# Patient Record
Sex: Female | Born: 1995 | Race: White | Hispanic: No | Marital: Single | Smoking: Never smoker
Health system: Southern US, Community
[De-identification: ages and names within clinical notes are randomized; demographics above are authoritative.]

## PROBLEM LIST (undated history)

## (undated) DIAGNOSIS — F909 Attention-deficit hyperactivity disorder, unspecified type: Secondary | ICD-10-CM

## (undated) DIAGNOSIS — R51 Headache: Secondary | ICD-10-CM

## (undated) DIAGNOSIS — J309 Allergic rhinitis, unspecified: Secondary | ICD-10-CM

## (undated) DIAGNOSIS — F8189 Other developmental disorders of scholastic skills: Secondary | ICD-10-CM

## (undated) DIAGNOSIS — R042 Hemoptysis: Secondary | ICD-10-CM

## (undated) DIAGNOSIS — B2790 Infectious mononucleosis, unspecified without complication: Principal | ICD-10-CM

## (undated) DIAGNOSIS — G43109 Migraine with aura, not intractable, without status migrainosus: Secondary | ICD-10-CM

## (undated) DIAGNOSIS — L708 Other acne: Secondary | ICD-10-CM

## (undated) DIAGNOSIS — J45909 Unspecified asthma, uncomplicated: Secondary | ICD-10-CM

## (undated) DIAGNOSIS — F919 Conduct disorder, unspecified: Secondary | ICD-10-CM

## (undated) DIAGNOSIS — K759 Inflammatory liver disease, unspecified: Secondary | ICD-10-CM

## (undated) DIAGNOSIS — Z872 Personal history of diseases of the skin and subcutaneous tissue: Secondary | ICD-10-CM

## (undated) HISTORY — DX: Infectious mononucleosis, unspecified without complication: B27.90

## (undated) HISTORY — DX: Other acne: L70.8

## (undated) HISTORY — DX: Personal history of diseases of the skin and subcutaneous tissue: Z87.2

## (undated) HISTORY — PX: NO PAST SURGERIES: SHX2092

## (undated) HISTORY — DX: Hemoptysis: R04.2

## (undated) HISTORY — DX: Inflammatory liver disease, unspecified: K75.9

## (undated) HISTORY — DX: Attention-deficit hyperactivity disorder, unspecified type: F90.9

## (undated) HISTORY — DX: Allergic rhinitis, unspecified: J30.9

## (undated) HISTORY — DX: Unspecified asthma, uncomplicated: J45.909

## (undated) HISTORY — DX: Conduct disorder, unspecified: F91.9

## (undated) HISTORY — DX: Migraine with aura, not intractable, without status migrainosus: G43.109

## (undated) HISTORY — DX: Other developmental disorders of scholastic skills: F81.89

## (undated) HISTORY — DX: Headache: R51

---
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
## 2015-05-07 ENCOUNTER — Ambulatory Visit (INDEPENDENT_AMBULATORY_CARE_PROVIDER_SITE_OTHER): Payer: Managed Care, Other (non HMO) | Admitting: Family Medicine

## 2015-05-07 ENCOUNTER — Encounter: Payer: Self-pay | Admitting: Family Medicine

## 2015-05-07 VITALS — BP 106/86 | HR 114 | Temp 98.4°F | Ht 62.0 in | Wt 128.0 lb

## 2015-05-07 DIAGNOSIS — R309 Painful micturition, unspecified: Secondary | ICD-10-CM | POA: Diagnosis not present

## 2015-05-07 DIAGNOSIS — N390 Urinary tract infection, site not specified: Secondary | ICD-10-CM | POA: Diagnosis not present

## 2015-05-07 LAB — POCT URINALYSIS DIPSTICK
Bilirubin, UA: NEGATIVE
GLUCOSE UA: NEGATIVE
Ketones, UA: NEGATIVE
NITRITE UA: NEGATIVE
Spec Grav, UA: >=1.03
Urobilinogen, UA: 0.2
pH, UA: 6

## 2015-05-07 MED ORDER — CIPROFLOXACIN HCL 500 MG PO TABS: 500.0000 mg | ORAL_TABLET | Freq: Two times a day (BID) | ORAL | Status: DC

## 2015-05-07 NOTE — Progress Notes (Signed)
   Subjective:    Patient ID: Paige CommanderCaroline G Dorr, female    DOB: 03/09/1996, 19 y.o.   MRN: [id number]  HPI Here for the onset this am of urinary burning and urgency. No fever.    Review of Systems  Constitutional: Negative.   Genitourinary: Positive for dysuria, urgency and frequency. Negative for flank pain and pelvic pain.       Objective:   Physical Exam  Constitutional: She appears well-developed and well-nourished.  Cardiovascular: Normal rate, regular rhythm, normal heart sounds and intact distal pulses.   Pulmonary/Chest: Effort normal and breath sounds normal.  Abdominal: Soft. Bowel sounds are normal. She exhibits no distension and no mass. There is no tenderness. There is no rebound and no guarding.          Assessment & Plan:  UTI, treat with Cipro. Drink plenty of water. Culture the sample.

## 2015-05-07 NOTE — Progress Notes (Signed)
Pre visit review using our clinic review tool, if applicable. No additional management support is needed unless otherwise documented below in the visit note. 

## 2015-05-09 LAB — URINE CULTURE

## 2015-05-16 ENCOUNTER — Ambulatory Visit (INDEPENDENT_AMBULATORY_CARE_PROVIDER_SITE_OTHER): Payer: Managed Care, Other (non HMO) | Admitting: Family Medicine

## 2015-05-16 ENCOUNTER — Encounter: Payer: Self-pay | Admitting: Family Medicine

## 2015-05-16 VITALS — BP 120/70 | Temp 98.5°F | Ht 62.0 in | Wt 128.0 lb

## 2015-05-16 DIAGNOSIS — J209 Acute bronchitis, unspecified: Secondary | ICD-10-CM

## 2015-05-16 MED ORDER — AZITHROMYCIN 250 MG PO TABS: ORAL_TABLET | ORAL | Status: DC

## 2015-05-16 NOTE — Progress Notes (Signed)
Pre visit review using our clinic review tool, if applicable. No additional management support is needed unless otherwise documented below in the visit note. 

## 2015-05-16 NOTE — Progress Notes (Signed)
   Subjective:    Patient ID: Nemiah CommanderCaroline G Chaviano, female    DOB: 04/29/1996, 19 y.o.   MRN: [id number]  HPI Here for 8 days of fever, PND, chest congestion, and a dry cough. On Mucinex. She was recently treated for  UTI, and those symptoms have all resolved.    Review of Systems  Constitutional: Positive for fever.  HENT: Positive for congestion and postnasal drip. Negative for sinus pressure and sore throat.   Eyes: Negative.   Respiratory: Positive for cough and chest tightness.        Objective:   Physical Exam  Constitutional: She appears well-developed and well-nourished.  HENT:  Right Ear: External ear normal.  Left Ear: External ear normal.  Nose: Nose normal.  Mouth/Throat: Oropharynx is clear and moist.  Eyes: Conjunctivae are normal.  Neck: No thyromegaly present.  Pulmonary/Chest: Effort normal. No respiratory distress. She has no wheezes. She has no rales.  Scattered rhonchi   Lymphadenopathy:    She has no cervical adenopathy.          Assessment & Plan:  Bronchitis, treat with a Zpack.

## 2015-08-15 ENCOUNTER — Encounter: Payer: Self-pay | Admitting: Family Medicine

## 2015-08-15 ENCOUNTER — Telehealth: Payer: Self-pay | Admitting: Family Medicine

## 2015-08-15 ENCOUNTER — Ambulatory Visit (INDEPENDENT_AMBULATORY_CARE_PROVIDER_SITE_OTHER): Payer: Managed Care, Other (non HMO) | Admitting: Family Medicine

## 2015-08-15 VITALS — BP 126/75 | HR 79 | Temp 98.4°F | Ht 62.0 in | Wt 128.0 lb

## 2015-08-15 DIAGNOSIS — R109 Unspecified abdominal pain: Secondary | ICD-10-CM | POA: Diagnosis not present

## 2015-08-15 DIAGNOSIS — J019 Acute sinusitis, unspecified: Secondary | ICD-10-CM

## 2015-08-15 LAB — POC URINALSYSI DIPSTICK (AUTOMATED)
BILIRUBIN UA: NEGATIVE
Glucose, UA: NEGATIVE
Ketones, UA: NEGATIVE
LEUKOCYTES UA: NEGATIVE
Nitrite, UA: NEGATIVE
Protein, UA: NEGATIVE
RBC UA: NEGATIVE
Spec Grav, UA: >=1.03
Urobilinogen, UA: 0.2
pH, UA: 5.5

## 2015-08-15 MED ORDER — AZITHROMYCIN 250 MG PO TABS: ORAL_TABLET | ORAL | Status: DC

## 2015-08-15 NOTE — Telephone Encounter (Signed)
Patient Name: Paige Herrera  DOB: April 24, 1996    Initial Comment Caller state she has a fever of 99.3   Nurse Assessment      Guidelines    Guideline Title Affirmed Question Affirmed Notes       Final Disposition User   FINAL ATTEMPT MADE - no message left Stand[place], RN, [employer]

## 2015-08-15 NOTE — Progress Notes (Signed)
   Subjective:    Patient ID: Paige Herrera, female    DOB: [birth year], 20 y.o.   MRN: [id number]  HPI Here for 3 days of low grade fevers, headache, ST, PND , dry cough, and some mild abdominal cramps. She vomited once last night. Drinking fluids.    Review of Systems  Constitutional: Positive for fever. Negative for chills and diaphoresis.  HENT: Positive for congestion, postnasal drip, sinus pressure and sore throat.   Eyes: Negative.   Respiratory: Negative.   Gastrointestinal: Positive for nausea, vomiting and abdominal pain. Negative for diarrhea, constipation, blood in stool, abdominal distention and anal bleeding.       Objective:   Physical Exam  Constitutional: She appears well-developed and well-nourished.  HENT:  Right Ear: External ear normal.  Left Ear: External ear normal.  Nose: Nose normal.  Mouth/Throat: Oropharynx is clear and moist.  Eyes: Conjunctivae are normal.  Neck: No thyromegaly present.  Pulmonary/Chest: Effort normal and breath sounds normal.  Abdominal: Soft. Bowel sounds are normal. She exhibits no distension and no mass. There is no tenderness. There is no rebound and no guarding.  Lymphadenopathy:    She has no cervical adenopathy.          Assessment & Plan:  Sinusitis secondary to a viral illness. Treat with a Zpack. Written out of work yesterday through tomorrow.

## 2015-08-15 NOTE — Telephone Encounter (Signed)
Pt is schedule to see Dr Clent Ridges today 08/815 at 11:00 am

## 2015-08-15 NOTE — Addendum Note (Signed)
Addended by: Aniceto Boss A on: 08/15/2015 01:35 PM   Modules accepted: Orders

## 2015-08-15 NOTE — Progress Notes (Signed)
Pre visit review using our clinic review tool, if applicable. No additional management support is needed unless otherwise documented below in the visit note. 

## 2015-10-03 ENCOUNTER — Telehealth: Payer: Self-pay | Admitting: Family Medicine

## 2015-10-03 NOTE — Telephone Encounter (Signed)
Pt has been sch

## 2015-10-03 NOTE — Telephone Encounter (Signed)
Any specific day? Can use a same day if we need to?

## 2015-10-03 NOTE — Telephone Encounter (Signed)
Pt would like to come in after 2pm . Pt is having trouble eating ?mono

## 2015-10-03 NOTE — Telephone Encounter (Signed)
Sorry this friday

## 2015-10-05 ENCOUNTER — Ambulatory Visit (INDEPENDENT_AMBULATORY_CARE_PROVIDER_SITE_OTHER): Payer: Managed Care, Other (non HMO) | Admitting: Family Medicine

## 2015-10-05 ENCOUNTER — Encounter: Payer: Self-pay | Admitting: Family Medicine

## 2015-10-05 VITALS — BP 112/67 | HR 82 | Temp 98.4°F | Ht 62.0 in | Wt 124.0 lb

## 2015-10-05 DIAGNOSIS — G43109 Migraine with aura, not intractable, without status migrainosus: Secondary | ICD-10-CM | POA: Diagnosis not present

## 2015-10-05 DIAGNOSIS — J3089 Other allergic rhinitis: Secondary | ICD-10-CM

## 2015-10-05 DIAGNOSIS — F4321 Adjustment disorder with depressed mood: Secondary | ICD-10-CM

## 2015-10-05 MED ORDER — PAROXETINE HCL 20 MG PO TABS: 20.0000 mg | ORAL_TABLET | Freq: Every day | ORAL | Status: DC

## 2015-10-05 NOTE — Progress Notes (Signed)
   Subjective:    Patient ID: Paige Herrera, female    DOB: 06/11/1996, 20 y.o.   MRN: [id number]  HPI Here with  Mother for several issues. First she decided to stop all her psychiatric meds suddenly about 2 weeks ago. This resulted in her feeling extremely stressed and she developed a migraine headache that has lasted 10 days.  No blurred vision or neurologic deficits. She also has had a week of sinus pressure, ear pressure, and PND. No ST or cough or fever.    Review of Systems  Constitutional: Negative.   HENT: Positive for congestion, ear pain, postnasal drip, sinus pressure and sneezing. Negative for sore throat.   Eyes: Negative.   Respiratory: Negative.   Cardiovascular: Negative.   Neurological: Positive for headaches.  Psychiatric/Behavioral: Positive for dysphoric mood, decreased concentration and agitation. Negative for suicidal ideas, behavioral problems and self-injury. The patient is nervous/anxious.        Objective:   Physical Exam  Constitutional: She is oriented to person, place, and time. She appears well-developed and well-nourished.  HENT:  Right Ear: External ear normal.  Left Ear: External ear normal.  Nose: Nose normal.  Mouth/Throat: Oropharynx is clear and moist.  Eyes: Conjunctivae are normal.  Neck: No thyromegaly present.  Cardiovascular: Normal rate, regular rhythm, normal heart sounds and intact distal pulses.   Pulmonary/Chest: Effort normal and breath sounds normal.  Lymphadenopathy:    She has no cervical adenopathy.  Neurological: She is alert and oriented to person, place, and time.  Psychiatric: Her behavior is normal. Thought content normal.  Anxious           Assessment & Plan:  First I advised her to never stop her medications without asking us for advice. I think her migraines were started by stopping her meds too abruptly. She will resume taking Wellbutrin XL 150 mg daily. She will also increase the Paxil to 20 mg daily. She is  having a flare of her spring time allergies so I advised her to start taking Claritin and Flonase every day. She may add Mucinex as needed for congestion.  Nelwyn SalisburyFRY,STEPHEN A, MD

## 2015-10-05 NOTE — Progress Notes (Signed)
Pre visit review using our clinic review tool, if applicable. No additional management support is needed unless otherwise documented below in the visit note. 

## 2015-10-08 ENCOUNTER — Encounter: Payer: Self-pay | Admitting: Family Medicine

## 2015-10-09 MED ORDER — METHYLPREDNISOLONE 4 MG PO TBPK: ORAL_TABLET | ORAL | Status: DC

## 2015-10-10 ENCOUNTER — Ambulatory Visit (INDEPENDENT_AMBULATORY_CARE_PROVIDER_SITE_OTHER): Payer: Managed Care, Other (non HMO) | Admitting: Family Medicine

## 2015-10-10 ENCOUNTER — Ambulatory Visit: Payer: Managed Care, Other (non HMO) | Admitting: Family Medicine

## 2015-10-10 ENCOUNTER — Encounter: Payer: Self-pay | Admitting: Family Medicine

## 2015-10-10 VITALS — BP 103/76 | HR 85 | Temp 97.8°F | Ht 62.0 in | Wt 126.0 lb

## 2015-10-10 DIAGNOSIS — G43109 Migraine with aura, not intractable, without status migrainosus: Secondary | ICD-10-CM | POA: Diagnosis not present

## 2015-10-10 MED ORDER — SUMATRIPTAN SUCCINATE 100 MG PO TABS: 100.0000 mg | ORAL_TABLET | ORAL | Status: DC | PRN

## 2015-10-10 NOTE — Progress Notes (Signed)
   Subjective:    Patient ID: Paige Herrera, female    DOB: 04/21/1996, 20 y.o.   MRN: [id number]  HPI Here for 3 days of intermittent headaches. Sometimes she wakes up with a headache, and other times it comes on during the day. These are centered over one eye or the other, but they then generalize over the top of the head. She gets very sensitive to light with these headaches. She often gets nausea wit them. No vision changes. Her next menstrual cycle is due to begin in 10 days. No sinus symptoms. She has been taking her Paxil and Wellbutrin every day as we discussed at our last visit.    Review of Systems  Constitutional: Negative.   Eyes: Positive for photophobia. Negative for pain and visual disturbance.  Respiratory: Negative.   Cardiovascular: Negative.   Neurological: Positive for headaches. Negative for dizziness, tremors, seizures, syncope, facial asymmetry, speech difficulty, weakness, light-headedness and numbness.  Psychiatric/Behavioral: Negative.        Objective:   Physical Exam  Constitutional: She is oriented to person, place, and time. She appears well-developed and well-nourished. No distress.  HENT:  Head: Normocephalic and atraumatic.  Eyes: Conjunctivae and EOM are normal. Pupils are equal, round, and reactive to light.  Neck: Neck supple. No thyromegaly present.  Cardiovascular: Normal rate, regular rhythm, normal heart sounds and intact distal pulses.   Pulmonary/Chest: Effort normal and breath sounds normal.  Lymphadenopathy:    She has no cervical adenopathy.  Neurological: She is alert and oriented to person, place, and time. No cranial nerve deficit. Coordination normal.          Assessment & Plan:  These are most likely migraines. Try Sumatriptan with Ibuprofen as needed. Recheck prn

## 2015-10-10 NOTE — Progress Notes (Signed)
Pre visit review using our clinic review tool, if applicable. No additional management support is needed unless otherwise documented below in the visit note. 

## 2015-11-11 ENCOUNTER — Other Ambulatory Visit: Payer: Self-pay | Admitting: Family Medicine

## 2015-11-12 NOTE — Telephone Encounter (Signed)
Refill for 90 days only. She needs a yearly pelvic exam and Pap smear. She could either see us or see a GYN.

## 2015-11-12 NOTE — Telephone Encounter (Signed)
I left a message with below information and sent script e-scribe.

## 2015-12-03 ENCOUNTER — Encounter: Payer: Self-pay | Admitting: Family Medicine

## 2015-12-04 NOTE — Telephone Encounter (Signed)
Per Dr. Clent RidgesFry okay to schedule pt for today.

## 2015-12-06 ENCOUNTER — Encounter: Payer: Self-pay | Admitting: Family Medicine

## 2015-12-06 ENCOUNTER — Ambulatory Visit: Payer: Managed Care, Other (non HMO) | Admitting: Internal Medicine

## 2015-12-06 ENCOUNTER — Telehealth: Payer: Self-pay | Admitting: Family Medicine

## 2015-12-06 MED ORDER — CIPROFLOXACIN HCL 500 MG PO TABS: 500.0000 mg | ORAL_TABLET | Freq: Two times a day (BID) | ORAL | Status: DC

## 2015-12-06 NOTE — Telephone Encounter (Signed)
Per Dr. Clent RidgesFry pt will need to schedule with another provider, we are full this morning.

## 2015-12-06 NOTE — Telephone Encounter (Signed)
This is a duplicate phone note, see previous one.  

## 2015-12-06 NOTE — Telephone Encounter (Signed)
Pt has been sch w/panosh at 1015 am today

## 2015-12-06 NOTE — Telephone Encounter (Signed)
Call in Cipro 500 mg bid for 7 days and drink lots of water

## 2015-12-06 NOTE — Telephone Encounter (Signed)
I sent in script and spoke with mom.

## 2015-12-06 NOTE — Telephone Encounter (Signed)
Mother would to know if you can see daughter for poss UTI this morning?

## 2015-12-06 NOTE — Telephone Encounter (Signed)
Mom states pt has burning and is positive pt has an UTI. Mom states Dr Clent RidgesFry has treated pt in the past for this same issue.  They are trying to leave for the funeral of her mom  by 12:30 today. Pt would like something called in to  [facility] [address]  Mom states she would not normally ask, but the are in a situation with all that is going on.

## 2016-01-23 ENCOUNTER — Telehealth: Payer: Self-pay | Admitting: Family Medicine

## 2016-01-23 NOTE — Telephone Encounter (Signed)
Pt mom would like dr Fabian Sharppanosh to do pap on her daughter instead of dr fry.Can I sch?

## 2016-01-23 NOTE — Telephone Encounter (Signed)
Respectfully decline .   Discuss   options with  PCP .

## 2016-01-24 NOTE — Telephone Encounter (Signed)
Pt mom is aware 

## 2016-02-05 ENCOUNTER — Other Ambulatory Visit: Payer: Self-pay | Admitting: Family Medicine

## 2016-03-04 ENCOUNTER — Other Ambulatory Visit: Payer: Self-pay | Admitting: Family Medicine

## 2016-04-04 ENCOUNTER — Ambulatory Visit (INDEPENDENT_AMBULATORY_CARE_PROVIDER_SITE_OTHER): Payer: Managed Care, Other (non HMO) | Admitting: Family Medicine

## 2016-04-04 VITALS — BP 113/78 | HR 87 | Temp 98.3°F | Ht 62.0 in | Wt 126.0 lb

## 2016-04-04 DIAGNOSIS — N390 Urinary tract infection, site not specified: Secondary | ICD-10-CM | POA: Diagnosis not present

## 2016-04-04 LAB — POC URINALSYSI DIPSTICK (AUTOMATED)
BILIRUBIN UA: NEGATIVE
Glucose, UA: NEGATIVE
KETONES UA: NEGATIVE
PH UA: 6
Urobilinogen, UA: 0.2

## 2016-04-04 MED ORDER — CIPROFLOXACIN HCL 500 MG PO TABS: 500.0000 mg | ORAL_TABLET | Freq: Two times a day (BID) | ORAL | 0 refills | Status: DC

## 2016-04-04 NOTE — Progress Notes (Signed)
Pre visit review using our clinic review tool, if applicable. No additional management support is needed unless otherwise documented below in the visit note. 

## 2016-04-07 ENCOUNTER — Encounter: Payer: Self-pay | Admitting: Family Medicine

## 2016-04-07 LAB — URINE CULTURE

## 2016-04-07 NOTE — Progress Notes (Signed)
   Subjective:    Patient ID: Paige CommanderCaroline G Goldston, female    DOB: 12/17/1995, 20 y.o.   MRN: [id number]  HPI Here for 2 days of pressure to urinate with burning. She drinks lots of water.    Review of Systems  Constitutional: Negative.   Respiratory: Negative.   Cardiovascular: Negative.   Gastrointestinal: Negative.   Genitourinary: Positive for dysuria, frequency and urgency. Negative for flank pain, hematuria and pelvic pain.       Objective:   Physical Exam  Constitutional: She appears well-developed and well-nourished.  Cardiovascular: Normal rate, regular rhythm, normal heart sounds and intact distal pulses.   Pulmonary/Chest: Effort normal and breath sounds normal.  Abdominal: Soft. Bowel sounds are normal. She exhibits no distension and no mass. There is no tenderness. There is no rebound and no guarding.          Assessment & Plan:  UTI, treat with Cipro. Culture the sample.  Nelwyn SalisburyFRY,Tasmia Blumer A, MD

## 2016-04-15 ENCOUNTER — Encounter: Payer: Self-pay | Admitting: Family Medicine

## 2016-04-15 ENCOUNTER — Ambulatory Visit (INDEPENDENT_AMBULATORY_CARE_PROVIDER_SITE_OTHER): Payer: Managed Care, Other (non HMO) | Admitting: Family Medicine

## 2016-04-15 VITALS — BP 111/79 | HR 85 | Temp 98.4°F | Ht 62.0 in | Wt 126.0 lb

## 2016-04-15 DIAGNOSIS — H6983 Other specified disorders of Eustachian tube, bilateral: Secondary | ICD-10-CM | POA: Diagnosis not present

## 2016-04-15 NOTE — Progress Notes (Signed)
Pre visit review using our clinic review tool, if applicable. No additional management support is needed unless otherwise documented below in the visit note. 

## 2016-04-15 NOTE — Progress Notes (Signed)
   Subjective:    Patient ID: Nemiah CommanderCaroline G Culbreth, female    DOB: 04/17/1996, 20 y.o.   MRN: [id number]  HPI Here for 2 months of pressure and mild pain in both ears. No headache or ST or cough. She takes an antihistamine every day but no other allergy type meds.    Review of Systems  Constitutional: Negative.   HENT: Positive for ear pain. Negative for congestion, ear discharge, facial swelling, hearing loss, postnasal drip, sinus pressure and sore throat.   Eyes: Negative.   Respiratory: Negative.   Neurological: Negative.        Objective:   Physical Exam  Constitutional: She is oriented to person, place, and time. She appears well-developed and well-nourished.  HENT:  Right Ear: External ear normal.  Left Ear: External ear normal.  Nose: Nose normal.  Mouth/Throat: Oropharynx is clear and moist.  Eyes: Conjunctivae are normal.  Neck: No thyromegaly present.  Lymphadenopathy:    She has no cervical adenopathy.  Neurological: She is alert and oriented to person, place, and time. No cranial nerve deficit.          Assessment & Plan:  Eustachian tube dysfunction. Add Flonase every day and use Mucinex bid until clear.  Nelwyn SalisburyFRY,STEPHEN A, MD

## 2016-04-21 ENCOUNTER — Encounter: Payer: Self-pay | Admitting: Family Medicine

## 2016-04-23 NOTE — Telephone Encounter (Signed)
This is a duplicate note, see previous.  

## 2016-04-23 NOTE — Telephone Encounter (Signed)
Mom following up on request for work note. Would like to pick up tomorroow please.  call mom [phone]

## 2016-05-12 ENCOUNTER — Ambulatory Visit (INDEPENDENT_AMBULATORY_CARE_PROVIDER_SITE_OTHER): Payer: Managed Care, Other (non HMO) | Admitting: Family Medicine

## 2016-05-12 ENCOUNTER — Encounter: Payer: Self-pay | Admitting: Family Medicine

## 2016-05-12 VITALS — BP 110/76 | HR 84 | Temp 98.3°F | Ht 62.0 in | Wt 121.0 lb

## 2016-05-12 DIAGNOSIS — F4321 Adjustment disorder with depressed mood: Secondary | ICD-10-CM | POA: Diagnosis not present

## 2016-05-12 DIAGNOSIS — G43109 Migraine with aura, not intractable, without status migrainosus: Secondary | ICD-10-CM

## 2016-05-12 MED ORDER — DIPHENOXYLATE-ATROPINE 2.5-0.025 MG PO TABS: 2.0000 | ORAL_TABLET | Freq: Four times a day (QID) | ORAL | 1 refills | Status: DC | PRN

## 2016-05-12 MED ORDER — DIAZEPAM 5 MG PO TABS: 5.0000 mg | ORAL_TABLET | Freq: Two times a day (BID) | ORAL | 0 refills | Status: DC | PRN

## 2016-05-12 MED ORDER — METHYLPREDNISOLONE ACETATE 80 MG/ML IJ SUSP
120.0000 mg | Freq: Once | INTRAMUSCULAR | Status: AC
  Administered 2016-05-12: 120 mg via INTRAMUSCULAR

## 2016-05-12 NOTE — Progress Notes (Signed)
   Subjective:    Patient ID: Paige Herrera, female    DOB: 1995/08/06, 20 y.o.   MRN: [id number]  HPI Here with her mother to follow up on sinus pressure, ear pain, headache, abdominal cramps, and diarrhea. She was here on 04-15-16 and we suggested she try Zyrtec, Mucinex,and Flonase. These measures did not help. Over the past week her GI symptoms have worsened. Per mother she has been very stressed out over school and family issues. The two of them have had many arguments, and mother thinks all of Tyesha's symptoms are related to stress. She has trouble sleeping and her appetite is poor. Eyva admits to feeling very anxious and somewhat depressed. She continues to take Wellbutrin daily and she takes Lorazepam at times. This helps but it does not last very long. She has never met with a psychotherapist but mother thinks this would be a good idea.    Review of Systems  Constitutional: Negative.   HENT: Positive for congestion, ear pain, postnasal drip, sinus pain and sinus pressure. Negative for sore throat.   Eyes: Negative.   Respiratory: Negative.   Cardiovascular: Negative.   Gastrointestinal: Positive for abdominal pain, diarrhea and nausea. Negative for abdominal distention, anal bleeding, blood in stool, constipation, rectal pain and vomiting.  Neurological: Negative.   Psychiatric/Behavioral: Positive for agitation, behavioral problems, dysphoric mood and sleep disturbance. Negative for confusion, decreased concentration, hallucinations, self-injury and suicidal ideas. The patient is nervous/anxious.        Objective:   Physical Exam  Constitutional: She is oriented to person, place, and time. She appears well-developed and well-nourished.  HENT:  Right Ear: External ear normal.  Left Ear: External ear normal.  Nose: Nose normal.  Mouth/Throat: Oropharynx is clear and moist.  Eyes: Conjunctivae are normal.  Neck: No thyromegaly present.  Cardiovascular: Normal rate,  regular rhythm, normal heart sounds and intact distal pulses.   Pulmonary/Chest: Effort normal and breath sounds normal.  Abdominal: Soft. Bowel sounds are normal. She exhibits no distension and no mass. There is no tenderness. There is no rebound and no guarding.  Lymphadenopathy:    She has no cervical adenopathy.  Neurological: She is alert and oriented to person, place, and time.  Psychiatric: Her behavior is normal. Judgment and thought content normal.  Anxious and tearful          Assessment & Plan:  She does have some allergy symptoms with head congestion so we will give her a steroid shot today. I agree with mother that her GI distress is mostly due to internalizing her stress. She can use Lomotil prn to relieve cramps and diarrhea. As for the depresion and anxiety, she will stay on Wellbutrin but she will try taking Diazepam for anxiety instead of Lorazepam. She would definitely benefit from psychotherapy, and we will try to get her in to see one of our [facility] therapists ASAP.  Laurey Morale, MD

## 2016-05-12 NOTE — Progress Notes (Signed)
Pre visit review using our clinic review tool, if applicable. No additional management support is needed unless otherwise documented below in the visit note. 

## 2016-05-19 ENCOUNTER — Ambulatory Visit (INDEPENDENT_AMBULATORY_CARE_PROVIDER_SITE_OTHER): Payer: Managed Care, Other (non HMO) | Admitting: Licensed Clinical Social Worker

## 2016-05-19 DIAGNOSIS — F3341 Major depressive disorder, recurrent, in partial remission: Secondary | ICD-10-CM

## 2016-05-25 IMAGING — MR MR HEAD WO/W CM
7 of 10 series · 29 of 48 positions shown · IV contrast (YES)
Comparison: 05/06/2011 CT head

CLINICAL DATA: BILATERAL eye pain with pressure. Floaters for 4
days.

EXAM:
MRI HEAD WITHOUT AND WITH CONTRAST
TECHNIQUE: Multiplanar, multiecho pulse sequences of the brain and surrounding
structures were obtained without and with intravenous contrast.
CONTRAST:  11mL MULTIHANCE GADOBENATE DIMEGLUMINE 529 MG/ML IV SOLN

[Series 3: FLAIR · sagittal · 5.0mm · 0.47mm/px · 3 of 25 slices shown (1 of 2)]
[im 1/25]
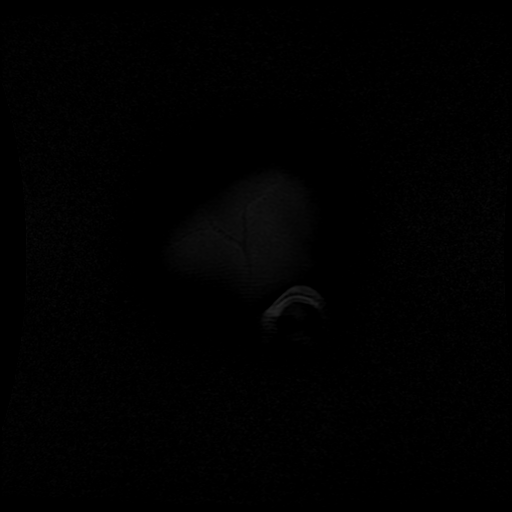
[im 13/25]
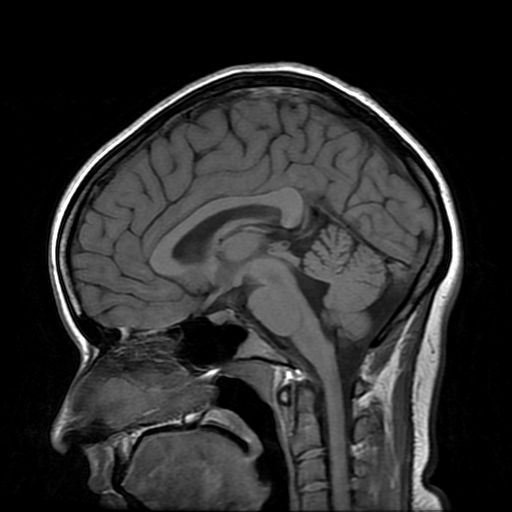
[im 25/25]
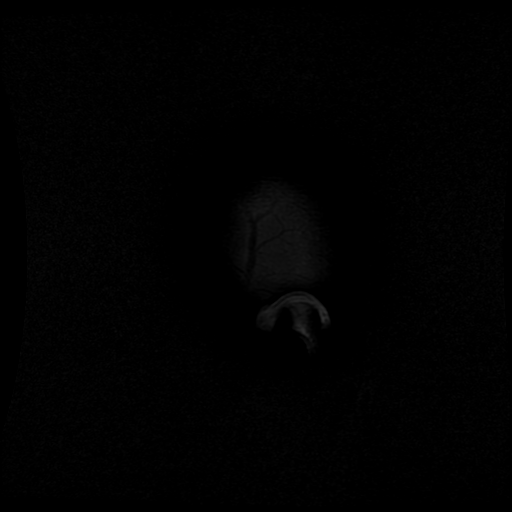

[Series 4: DWI · axial · 3.0mm · 1.09mm/px · z∈[-93,+54]mm · 9 of 108 slices shown (1 of 2)]
[im 1/108]
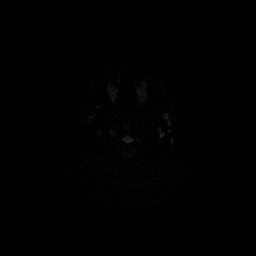
[im 20/108]
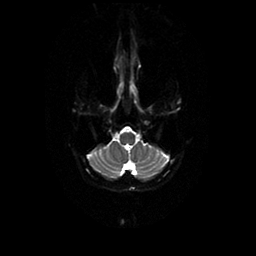
[im 30/108]
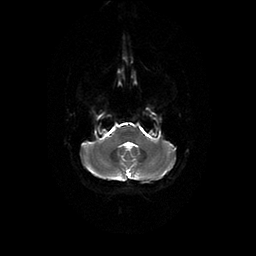
[im 49/108]
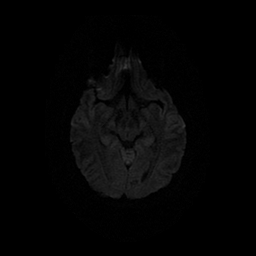
[im 59/108]
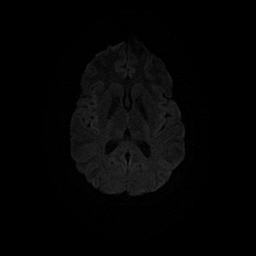
[im 78/108]
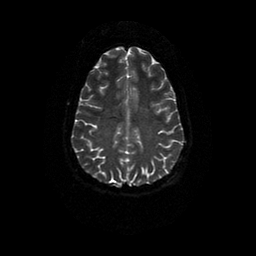
[im 88/108]
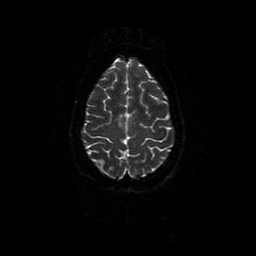
[im 98/108]
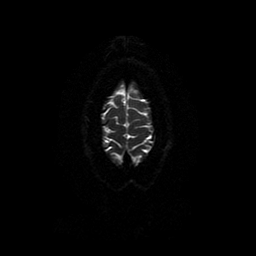
[im 108/108]
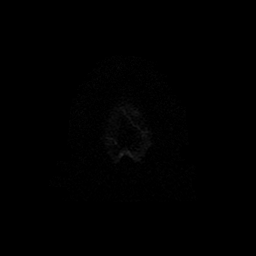

[Series 5: T2-star · axial · 5.0mm · 0.43mm/px · z∈[-73,-1]mm · 2 of 27 slices shown]
[im 1/27]
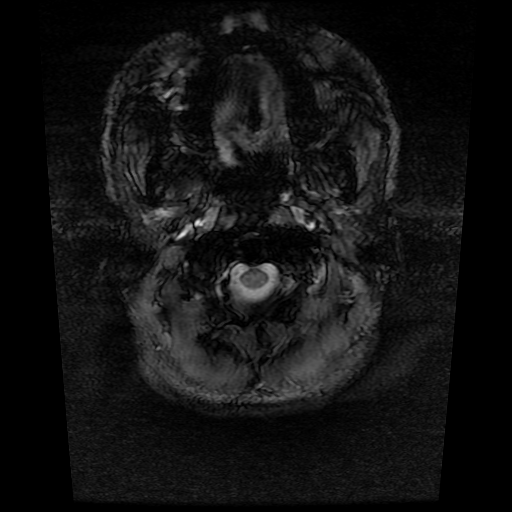
[im 14/27]
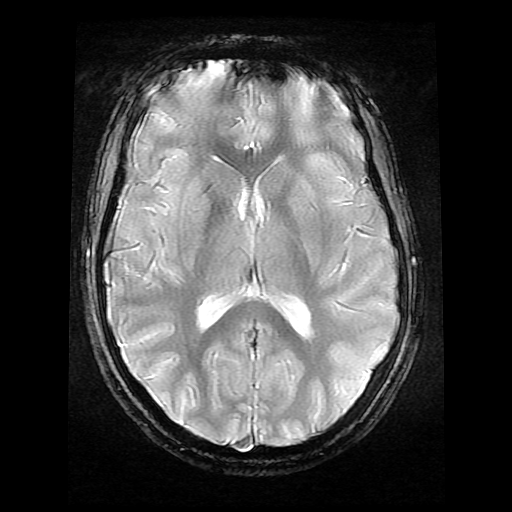

[Series 7: FLAIR · axial · 5.0mm · 0.43mm/px · z∈[-73,+71]mm · 3 of 27 slices shown (2 of 2)]
[im 1/27]
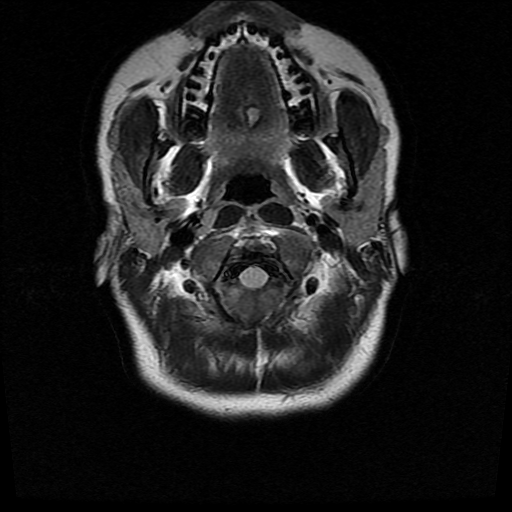
[im 14/27]
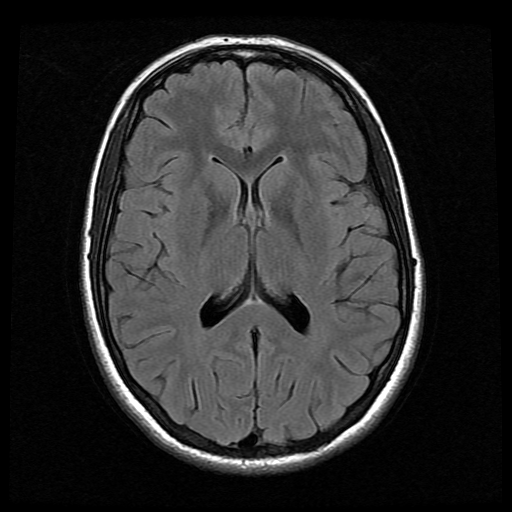
[im 27/27]
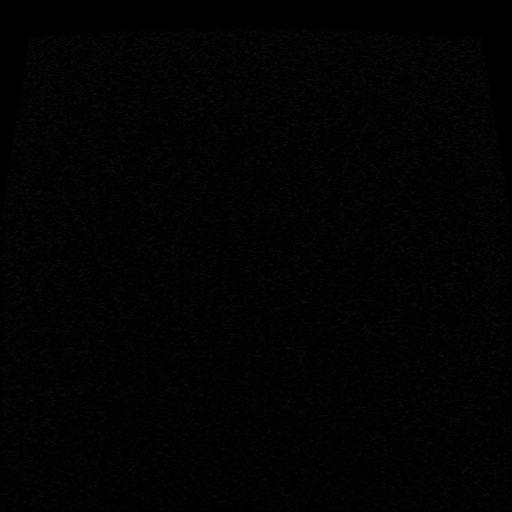

[Series 9: T2 post-contrast · coronal · 5.0mm · 0.43mm/px · 3 of 29 slices shown]
[im 1/29]
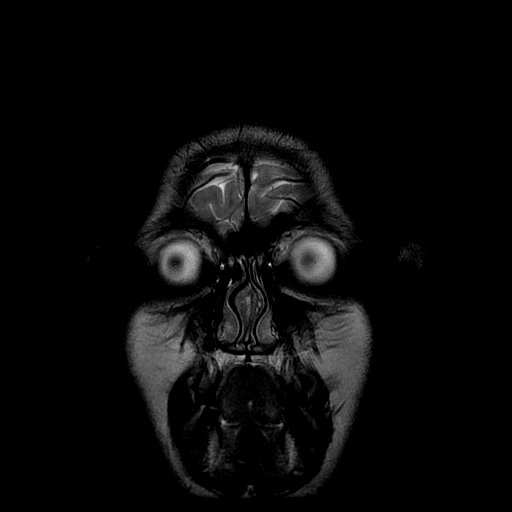
[im 15/29]
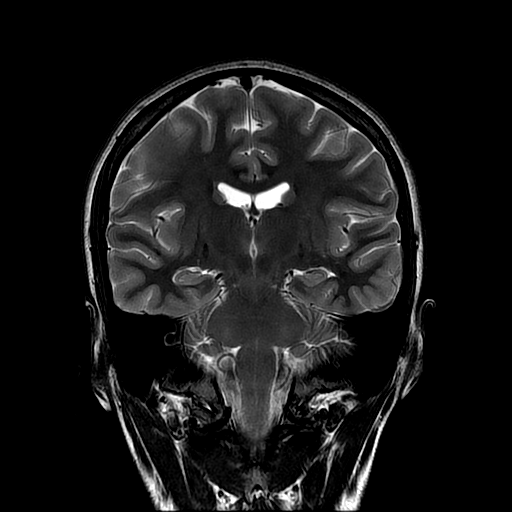
[im 29/29]
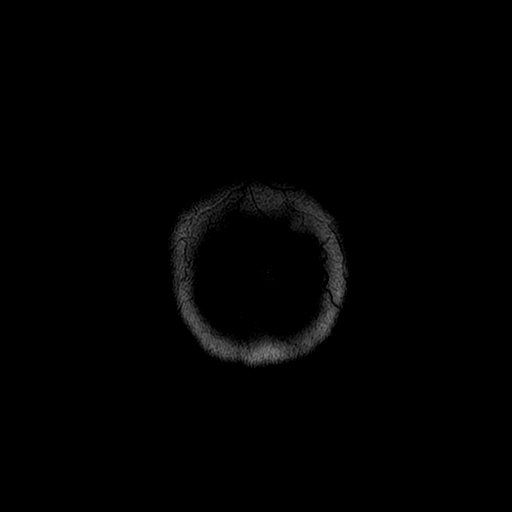

[Series 11: T1 · coronal · 5.0mm · 0.43mm/px · 3 of 29 slices shown]
[im 1/29]
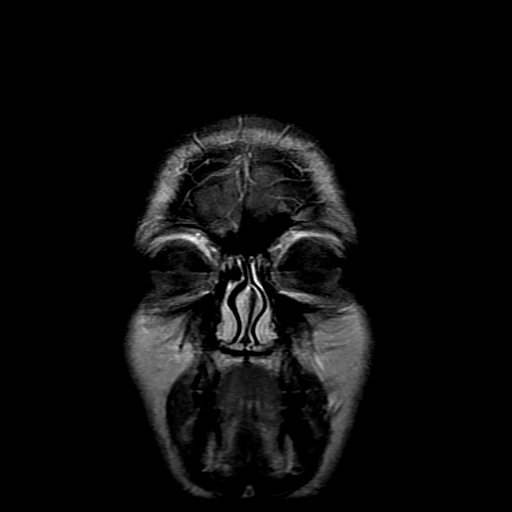
[im 15/29]
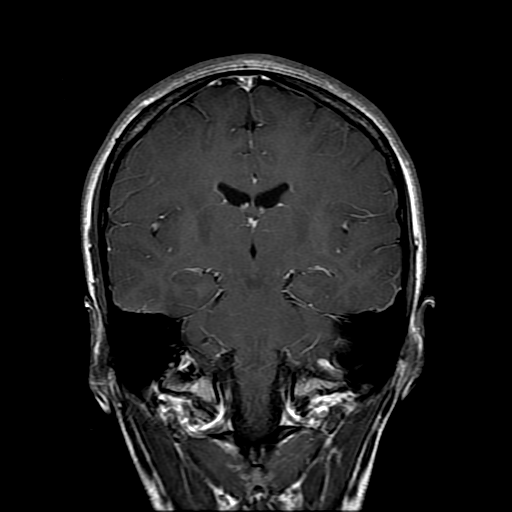
[im 29/29]
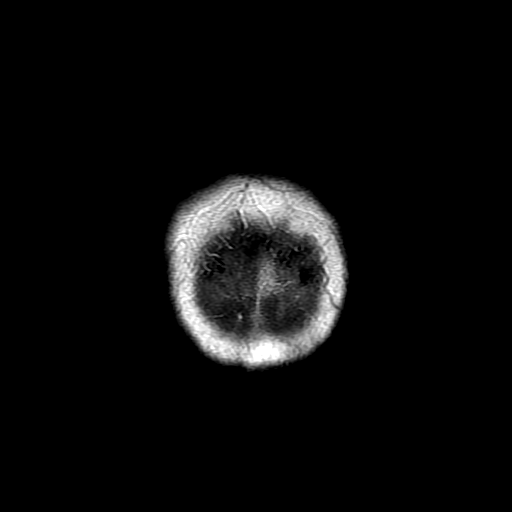

[Series 400: DWI · axial · 3.0mm · 1.09mm/px · z∈[-93,+54]mm · 6 of 54 slices shown (2 of 2)]
[im 1/54]
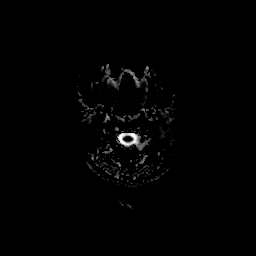
[im 11/54]
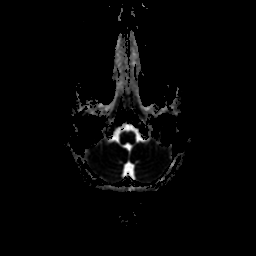
[im 22/54]
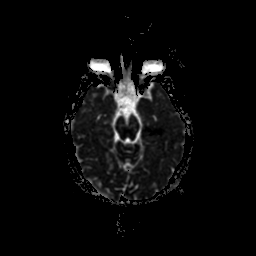
[im 32/54]
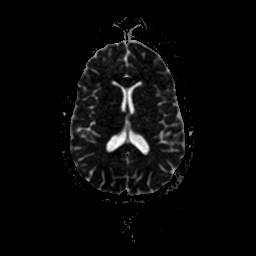
[im 43/54]
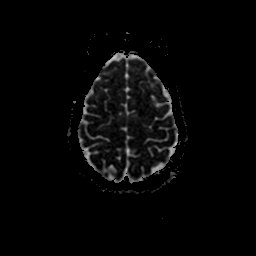
[im 54/54]
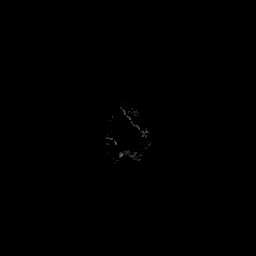

[29 of 48 positions shown; findings below may reference images not displayed]

FINDINGS: No evidence for acute infarction, hemorrhage, mass lesion,
hydrocephalus, or extra-axial fluid. There is no atrophy or white
matter disease. There is no congenital anomaly. Pituitary, pineal,
and cerebellar tonsils unremarkable. No upper cervical lesions. Flow
voids are maintained throughout the carotid, basilar, and vertebral
arteries. There are no areas of chronic hemorrhage. Post infusion,
no abnormal enhancement of the brain or meninges. Visualized
calvarium, skull base, and upper cervical osseous structures
unremarkable. Scalp and extracranial soft tissues, orbits, sinuses,
and mastoids show no acute process.
IMPRESSION: Negative exam.  No acute or focal intracranial findings.

Specifically no evidence for orbital lesion. No white matter disease
to suggest complicated migraine.

## 2016-05-27 ENCOUNTER — Ambulatory Visit (INDEPENDENT_AMBULATORY_CARE_PROVIDER_SITE_OTHER): Payer: Managed Care, Other (non HMO) | Admitting: Licensed Clinical Social Worker

## 2016-05-27 DIAGNOSIS — F3341 Major depressive disorder, recurrent, in partial remission: Secondary | ICD-10-CM | POA: Diagnosis not present

## 2016-06-10 ENCOUNTER — Other Ambulatory Visit: Payer: Self-pay | Admitting: Family Medicine

## 2016-06-10 ENCOUNTER — Ambulatory Visit (INDEPENDENT_AMBULATORY_CARE_PROVIDER_SITE_OTHER): Payer: Managed Care, Other (non HMO) | Admitting: Licensed Clinical Social Worker

## 2016-06-10 DIAGNOSIS — F3341 Major depressive disorder, recurrent, in partial remission: Secondary | ICD-10-CM

## 2016-06-10 NOTE — Telephone Encounter (Signed)
Call in #[phone] with no rf 

## 2016-06-10 NOTE — Telephone Encounter (Signed)
Mom calling to follow up for refill request because pt has only one diazepam (VALIUM) 5 MG tablet  left. Hopes to get refill asap.  [facility] [address]

## 2016-06-20 ENCOUNTER — Ambulatory Visit: Payer: Managed Care, Other (non HMO) | Admitting: Licensed Clinical Social Worker

## 2016-06-20 ENCOUNTER — Ambulatory Visit (INDEPENDENT_AMBULATORY_CARE_PROVIDER_SITE_OTHER): Payer: Managed Care, Other (non HMO) | Admitting: Licensed Clinical Social Worker

## 2016-06-20 DIAGNOSIS — F3341 Major depressive disorder, recurrent, in partial remission: Secondary | ICD-10-CM | POA: Diagnosis not present

## 2016-06-24 ENCOUNTER — Ambulatory Visit (INDEPENDENT_AMBULATORY_CARE_PROVIDER_SITE_OTHER): Payer: Managed Care, Other (non HMO) | Admitting: Licensed Clinical Social Worker

## 2016-06-24 DIAGNOSIS — F3341 Major depressive disorder, recurrent, in partial remission: Secondary | ICD-10-CM | POA: Diagnosis not present

## 2016-07-08 ENCOUNTER — Encounter: Payer: Self-pay | Admitting: Family Medicine

## 2016-07-08 ENCOUNTER — Ambulatory Visit (INDEPENDENT_AMBULATORY_CARE_PROVIDER_SITE_OTHER): Payer: Managed Care, Other (non HMO) | Admitting: Family Medicine

## 2016-07-08 VITALS — BP 96/50 | HR 102 | Temp 98.0°F | Ht 62.0 in | Wt 119.0 lb

## 2016-07-08 DIAGNOSIS — N3000 Acute cystitis without hematuria: Secondary | ICD-10-CM | POA: Diagnosis not present

## 2016-07-08 LAB — POC URINALSYSI DIPSTICK (AUTOMATED)
PH UA: 5
RBC UA: NEGATIVE
Spec Grav, UA: >=1.03
UROBILINOGEN UA: 2

## 2016-07-08 MED ORDER — SULFAMETHOXAZOLE-TRIMETHOPRIM 800-160 MG PO TABS: 1.0000 | ORAL_TABLET | Freq: Two times a day (BID) | ORAL | 0 refills | Status: DC

## 2016-07-08 NOTE — Addendum Note (Signed)
Addended by: Aniceto BossNIMMONS, SYLVIA A on: 07/08/2016 02:21 PM   Modules accepted: Orders

## 2016-07-08 NOTE — Progress Notes (Signed)
Pre visit review using our clinic review tool, if applicable. No additional management support is needed unless otherwise documented below in the visit note. 

## 2016-07-08 NOTE — Progress Notes (Signed)
   Subjective:    Patient ID: Paige Herrera, female    DOB: 02/12/1996, 20 y.o.   MRN: [id number]  HPI Here for 3 days of urinary pressure and frequency. No fever or back pain. Per her mother she took a tub bath the dya before these symptoms started, and this is unusual for her.    Review of Systems  Constitutional: Negative.   Respiratory: Negative.   Cardiovascular: Negative.   Gastrointestinal: Negative.   Genitourinary: Positive for dysuria, frequency and urgency. Negative for flank pain, hematuria and pelvic pain.       Objective:   Physical Exam  Constitutional: She is oriented to person, place, and time. She appears well-developed and well-nourished.  Abdominal: Soft. Bowel sounds are normal. She exhibits no distension and no mass. There is no tenderness. There is no rebound and no guarding.  Neurological: She is alert and oriented to person, place, and time.          Assessment & Plan:  UTI, treat with Bactrim DS. Drink lots of water. Culture the sample.  Gershon CraneStephen Fry, MD

## 2016-07-10 LAB — URINE CULTURE: ORGANISM ID, BACTERIA: NO GROWTH

## 2016-07-15 ENCOUNTER — Ambulatory Visit (INDEPENDENT_AMBULATORY_CARE_PROVIDER_SITE_OTHER): Payer: Managed Care, Other (non HMO) | Admitting: Licensed Clinical Social Worker

## 2016-07-15 DIAGNOSIS — F3341 Major depressive disorder, recurrent, in partial remission: Secondary | ICD-10-CM | POA: Diagnosis not present

## 2016-08-19 ENCOUNTER — Ambulatory Visit (INDEPENDENT_AMBULATORY_CARE_PROVIDER_SITE_OTHER): Payer: Managed Care, Other (non HMO) | Admitting: Licensed Clinical Social Worker

## 2016-08-19 DIAGNOSIS — F3341 Major depressive disorder, recurrent, in partial remission: Secondary | ICD-10-CM

## 2016-09-10 ENCOUNTER — Ambulatory Visit (INDEPENDENT_AMBULATORY_CARE_PROVIDER_SITE_OTHER): Payer: Managed Care, Other (non HMO) | Admitting: Licensed Clinical Social Worker

## 2016-09-10 DIAGNOSIS — F3341 Major depressive disorder, recurrent, in partial remission: Secondary | ICD-10-CM | POA: Diagnosis not present

## 2016-10-17 ENCOUNTER — Ambulatory Visit (INDEPENDENT_AMBULATORY_CARE_PROVIDER_SITE_OTHER): Payer: 59 | Admitting: Licensed Clinical Social Worker

## 2016-10-17 DIAGNOSIS — F3341 Major depressive disorder, recurrent, in partial remission: Secondary | ICD-10-CM

## 2016-12-04 ENCOUNTER — Ambulatory Visit (INDEPENDENT_AMBULATORY_CARE_PROVIDER_SITE_OTHER): Payer: 59 | Admitting: Licensed Clinical Social Worker

## 2016-12-04 DIAGNOSIS — F3341 Major depressive disorder, recurrent, in partial remission: Secondary | ICD-10-CM

## 2016-12-24 ENCOUNTER — Telehealth: Payer: Self-pay | Admitting: Family Medicine

## 2016-12-24 NOTE — Telephone Encounter (Signed)
Pt needs a pap and wants to know if Dr Fabian SharpPanosh would mind doing her pap.

## 2016-12-24 NOTE — Telephone Encounter (Signed)
Left message on voice mail  to call back

## 2016-12-24 NOTE — Telephone Encounter (Signed)
Our policy is for patients to stay with a single provider for everything. If she prefers a female to do the Pap, I suggest she establish with a female GYN provider

## 2016-12-24 NOTE — Telephone Encounter (Signed)
I agree

## 2016-12-25 NOTE — Telephone Encounter (Signed)
Pt  Mom is aware. Pt mom was given local ob gyn office phone number

## 2017-01-05 ENCOUNTER — Other Ambulatory Visit: Payer: Self-pay | Admitting: Family Medicine

## 2017-01-23 ENCOUNTER — Ambulatory Visit (INDEPENDENT_AMBULATORY_CARE_PROVIDER_SITE_OTHER): Payer: 59 | Admitting: Family Medicine

## 2017-01-23 ENCOUNTER — Encounter: Payer: Self-pay | Admitting: Family Medicine

## 2017-01-23 VITALS — BP 106/76 | HR 84 | Temp 98.6°F | Ht 62.0 in | Wt 110.0 lb

## 2017-01-23 DIAGNOSIS — R109 Unspecified abdominal pain: Secondary | ICD-10-CM | POA: Diagnosis not present

## 2017-01-23 DIAGNOSIS — J018 Other acute sinusitis: Secondary | ICD-10-CM

## 2017-01-23 LAB — POC URINALSYSI DIPSTICK (AUTOMATED)
Bilirubin, UA: NEGATIVE
Blood, UA: NEGATIVE
Glucose, UA: NEGATIVE
Ketones, UA: NEGATIVE
LEUKOCYTES UA: NEGATIVE
NITRITE UA: NEGATIVE
PROTEIN UA: NEGATIVE
Spec Grav, UA: >=1.03 — AB (ref 1.010–1.025)
Urobilinogen, UA: 0.2 E.U./dL
pH, UA: 6 (ref 5.0–8.0)

## 2017-01-23 MED ORDER — AZITHROMYCIN 250 MG PO TABS: ORAL_TABLET | ORAL | 0 refills | Status: DC

## 2017-01-23 MED ORDER — PROMETHAZINE HCL 25 MG PO TABS: 25.0000 mg | ORAL_TABLET | ORAL | 1 refills | Status: DC | PRN

## 2017-01-23 NOTE — Addendum Note (Signed)
Addended by: Aniceto BossNIMMONS, SYLVIA A on: 01/23/2017 12:05 PM   Modules accepted: Orders

## 2017-01-23 NOTE — Patient Instructions (Signed)
WE NOW OFFER   [facility] FAST TRACK!!!  SAME DAY Appointments for ACUTE CARE  Such as: Sprains, Injuries, cuts, abrasions, rashes, muscle pain, joint pain, back pain Colds, flu, sore throats, headache, allergies, cough, fever  Ear pain, sinus and eye infections Abdominal pain, nausea, vomiting, diarrhea, upset stomach Animal/insect bites  3 Easy Ways to Schedule: Walk-In Scheduling Call in scheduling Mychart Sign-up: [email or web address]         

## 2017-01-23 NOTE — Progress Notes (Signed)
   Subjective:    Patient ID: Nemiah CommanderCaroline G Bloomquist, female    DOB: 07/08/1995, 21 y.o.   MRN: [id number]  HPI Here for 3 days of sinus pressure, PND, abdominal cramps, nausea without vomiting, and diarrhea. No fever. Drinking fluids.    Review of Systems  Constitutional: Negative.   HENT: Positive for congestion, postnasal drip and sinus pressure. Negative for sinus pain.   Eyes: Negative.   Respiratory: Negative.   Gastrointestinal: Positive for abdominal pain, diarrhea and nausea. Negative for abdominal distention, anal bleeding, blood in stool, constipation and vomiting.  Genitourinary: Negative.        Objective:   Physical Exam  Constitutional: She appears well-developed and well-nourished.  HENT:  Right Ear: External ear normal.  Left Ear: External ear normal.  Nose: Nose normal.  Mouth/Throat: Oropharynx is clear and moist.  Eyes: Conjunctivae are normal.  Neck: Neck supple. No thyromegaly present.  Pulmonary/Chest: Effort normal and breath sounds normal. No respiratory distress. She has no wheezes. She has no rales.  Abdominal: Soft. Bowel sounds are normal. She exhibits no distension and no mass. There is no rebound and no guarding.  Mild generalized tenderness   Lymphadenopathy:    She has no cervical adenopathy.          Assessment & Plan:  Sinusitis with GI effects. Given a Zpack and she can use Phenergan prn.  Gershon CraneStephen Bertie Mcconathy, MD

## 2017-03-26 ENCOUNTER — Encounter: Payer: Self-pay | Admitting: Family Medicine

## 2017-03-30 ENCOUNTER — Ambulatory Visit (INDEPENDENT_AMBULATORY_CARE_PROVIDER_SITE_OTHER): Payer: 59 | Admitting: Licensed Clinical Social Worker

## 2017-03-30 DIAGNOSIS — F331 Major depressive disorder, recurrent, moderate: Secondary | ICD-10-CM

## 2017-04-21 ENCOUNTER — Ambulatory Visit (INDEPENDENT_AMBULATORY_CARE_PROVIDER_SITE_OTHER): Payer: 59 | Admitting: Licensed Clinical Social Worker

## 2017-04-21 DIAGNOSIS — F3341 Major depressive disorder, recurrent, in partial remission: Secondary | ICD-10-CM | POA: Diagnosis not present

## 2017-06-16 ENCOUNTER — Ambulatory Visit (INDEPENDENT_AMBULATORY_CARE_PROVIDER_SITE_OTHER): Payer: 59 | Admitting: Licensed Clinical Social Worker

## 2017-06-16 DIAGNOSIS — F3341 Major depressive disorder, recurrent, in partial remission: Secondary | ICD-10-CM | POA: Diagnosis not present

## 2017-06-25 ENCOUNTER — Ambulatory Visit (INDEPENDENT_AMBULATORY_CARE_PROVIDER_SITE_OTHER): Payer: 59 | Admitting: Licensed Clinical Social Worker

## 2017-06-25 DIAGNOSIS — F3341 Major depressive disorder, recurrent, in partial remission: Secondary | ICD-10-CM | POA: Diagnosis not present

## 2017-07-09 ENCOUNTER — Ambulatory Visit (INDEPENDENT_AMBULATORY_CARE_PROVIDER_SITE_OTHER): Payer: 59 | Admitting: Licensed Clinical Social Worker

## 2017-07-09 DIAGNOSIS — F3341 Major depressive disorder, recurrent, in partial remission: Secondary | ICD-10-CM

## 2017-07-23 ENCOUNTER — Ambulatory Visit (INDEPENDENT_AMBULATORY_CARE_PROVIDER_SITE_OTHER): Payer: 59 | Admitting: Licensed Clinical Social Worker

## 2017-07-23 DIAGNOSIS — F3341 Major depressive disorder, recurrent, in partial remission: Secondary | ICD-10-CM | POA: Diagnosis not present

## 2017-08-11 ENCOUNTER — Ambulatory Visit (INDEPENDENT_AMBULATORY_CARE_PROVIDER_SITE_OTHER): Payer: 59 | Admitting: Licensed Clinical Social Worker

## 2017-08-11 DIAGNOSIS — F3341 Major depressive disorder, recurrent, in partial remission: Secondary | ICD-10-CM

## 2017-10-01 ENCOUNTER — Ambulatory Visit (INDEPENDENT_AMBULATORY_CARE_PROVIDER_SITE_OTHER): Payer: 59 | Admitting: Licensed Clinical Social Worker

## 2017-10-01 DIAGNOSIS — F3341 Major depressive disorder, recurrent, in partial remission: Secondary | ICD-10-CM

## 2017-10-17 ENCOUNTER — Other Ambulatory Visit: Payer: Self-pay | Admitting: Family Medicine

## 2017-10-19 ENCOUNTER — Ambulatory Visit (INDEPENDENT_AMBULATORY_CARE_PROVIDER_SITE_OTHER): Payer: No Typology Code available for payment source | Admitting: Licensed Clinical Social Worker

## 2017-10-19 DIAGNOSIS — F3341 Major depressive disorder, recurrent, in partial remission: Secondary | ICD-10-CM | POA: Diagnosis not present

## 2017-10-20 NOTE — Telephone Encounter (Signed)
Last OV 01/23/2017   Sent to PCP to advise

## 2017-11-03 ENCOUNTER — Ambulatory Visit (INDEPENDENT_AMBULATORY_CARE_PROVIDER_SITE_OTHER): Payer: No Typology Code available for payment source | Admitting: Licensed Clinical Social Worker

## 2017-11-03 DIAGNOSIS — F3340 Major depressive disorder, recurrent, in remission, unspecified: Secondary | ICD-10-CM

## 2017-11-06 ENCOUNTER — Telehealth: Payer: Self-pay | Admitting: Family Medicine

## 2017-11-06 NOTE — Telephone Encounter (Signed)
Last OV 01/23/2017   Last refilled

## 2017-11-10 NOTE — Telephone Encounter (Signed)
Medication filled to pharmacy as requested on 11/06/2017.

## 2017-11-10 NOTE — Telephone Encounter (Signed)
Pts mom calling and states that the refill should have been for NIKKI 3-0.02 MG tablet Not for what the pharmacy requested.

## 2017-11-18 ENCOUNTER — Ambulatory Visit (INDEPENDENT_AMBULATORY_CARE_PROVIDER_SITE_OTHER): Payer: No Typology Code available for payment source | Admitting: Licensed Clinical Social Worker

## 2017-11-18 DIAGNOSIS — F3341 Major depressive disorder, recurrent, in partial remission: Secondary | ICD-10-CM

## 2017-11-21 ENCOUNTER — Telehealth: Payer: Self-pay | Admitting: Nurse Practitioner

## 2017-11-21 DIAGNOSIS — N3000 Acute cystitis without hematuria: Secondary | ICD-10-CM

## 2017-11-21 MED ORDER — NITROFURANTOIN MONOHYD MACRO 100 MG PO CAPS: 100.0000 mg | ORAL_CAPSULE | Freq: Two times a day (BID) | ORAL | 0 refills | Status: DC

## 2017-11-21 NOTE — Addendum Note (Signed)
Addended by: Bennie Pierini on: 11/21/2017 08:20 AM   Modules accepted: Orders

## 2017-11-21 NOTE — Progress Notes (Signed)

## 2017-12-15 ENCOUNTER — Ambulatory Visit (INDEPENDENT_AMBULATORY_CARE_PROVIDER_SITE_OTHER): Payer: No Typology Code available for payment source | Admitting: Licensed Clinical Social Worker

## 2017-12-15 DIAGNOSIS — F3341 Major depressive disorder, recurrent, in partial remission: Secondary | ICD-10-CM

## 2018-01-14 ENCOUNTER — Ambulatory Visit (INDEPENDENT_AMBULATORY_CARE_PROVIDER_SITE_OTHER): Payer: No Typology Code available for payment source | Admitting: Licensed Clinical Social Worker

## 2018-01-14 DIAGNOSIS — F3341 Major depressive disorder, recurrent, in partial remission: Secondary | ICD-10-CM | POA: Diagnosis not present

## 2018-01-21 ENCOUNTER — Ambulatory Visit: Payer: No Typology Code available for payment source | Admitting: Licensed Clinical Social Worker

## 2018-01-28 ENCOUNTER — Encounter: Payer: Self-pay | Admitting: *Deleted

## 2018-01-28 ENCOUNTER — Ambulatory Visit: Payer: Self-pay | Admitting: *Deleted

## 2018-01-28 ENCOUNTER — Ambulatory Visit (INDEPENDENT_AMBULATORY_CARE_PROVIDER_SITE_OTHER): Payer: No Typology Code available for payment source | Admitting: Family Medicine

## 2018-01-28 ENCOUNTER — Encounter: Payer: Self-pay | Admitting: Family Medicine

## 2018-01-28 VITALS — BP 120/80 | HR 95 | Temp 98.2°F | Ht 62.0 in | Wt 107.5 lb

## 2018-01-28 DIAGNOSIS — Z23 Encounter for immunization: Secondary | ICD-10-CM | POA: Diagnosis not present

## 2018-01-28 DIAGNOSIS — W540XXA Bitten by dog, initial encounter: Secondary | ICD-10-CM | POA: Diagnosis not present

## 2018-01-28 DIAGNOSIS — Z7185 Encounter for immunization safety counseling: Secondary | ICD-10-CM

## 2018-01-28 DIAGNOSIS — R0981 Nasal congestion: Secondary | ICD-10-CM

## 2018-01-28 DIAGNOSIS — S61452A Open bite of left hand, initial encounter: Secondary | ICD-10-CM | POA: Diagnosis not present

## 2018-01-28 DIAGNOSIS — Z7189 Other specified counseling: Secondary | ICD-10-CM

## 2018-01-28 DIAGNOSIS — J029 Acute pharyngitis, unspecified: Secondary | ICD-10-CM

## 2018-01-28 LAB — POCT RAPID STREP A (OFFICE): Rapid Strep A Screen: NEGATIVE

## 2018-01-28 NOTE — Addendum Note (Signed)
Addended by: Johnella MoloneyFUNDERBURK, Pariss Hommes A on: 01/28/2018 02:45 PM   Modules accepted: Orders

## 2018-01-28 NOTE — Progress Notes (Signed)
HPI:  Using dictation device. Unfortunately this device frequently misinterprets words/phrases.  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  Past Medical History:  Diagnosis Date  . 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 ADHD 05/03/2009  . ALLERGIC RHINITIS 07/14/2007  . 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 ASTHMA 07/14/2007  . EBV infection 11/28/2014  . ECZEMA, HX OF 07/14/2007  . Headache(784.0) 11/27/2008  . Hemoptysis 11/28/2014  . Hepatitis 11/28/2014  . LEARNING DISABILITY 10/08/2007  . Migraine with aura 11/28/2014  . 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 NO PAST SURGERIES      Family History  Problem Relation Age of Onset  . Asthma Mother   . Allergies Mother   . Hypertension Father     SOCIAL HX: see hpi   Current Outpatient Medications:  .  buPROPion (WELLBUTRIN XL) 300 MG 24 hr tablet, Take 300 mg by mouth daily., Disp: , Rfl:  .  diazepam (VALIUM) 5 MG tablet, TAKE 1 TABLET BY MOUTH EVERY 12 HOURS AS NEEDED FOR ANXIETY (Patient taking differently: TAKE 1 TABLET BY MOUTH EVERY MORNING AND LATER IF NEEDED), Disp: 60 tablet, Rfl: 0 .  NIKKI 3-0.02 MG tablet, TAKE 1 TABLET BY MOUTH DAILY., Disp: 28 tablet, Rfl: 10 .  OVER THE COUNTER MEDICATION, Allergy tec, Disp: , Rfl:   EXAM:  Vitals:   01/28/18 1352  BP: 120/80  Pulse: 95  Temp: 98.2 F (36.8 C)    Body mass index is 19.66 kg/m.  GENERAL: vitals reviewed and listed above,  alert, oriented, appears well hydrated and in no acute distress  HEENT: atraumatic, conjunttiva clear, no obvious abnormalities on inspection of external nose and ears, normal appearance of ear canals and TMs, clear nasal congestion, mild post oropharyngeal erythema with PND, 1-2+ tonsillar edema, no exudate, no sinus TTP  NECK: no obvious masses on inspection  LUNGS: clear to auscultation bilaterally, no wheezes, rales or rhonchi, good air movement  CV: HRRR, no peripheral edema  SKIN: very small superficial healed abrasion like lesion L dorsal hand - no signs infection, no ttp, no pus or drainage, no edema or warmth  MS: moves all extremities without noticeable abnormality  PSYCH: pleasant and cooperative, no obvious depression or anxiety  ASSESSMENT AND PLAN:  Discussed the following assessment and plan: More than 50% of over 25 minutes spent in total in caring for this patient was spent face-to-face with the patient, counseling and/or coordinating care.    Sore throat Nasal congestion -likely mild viral infection, she denies any sig allergy symptoms other then mild cough -strep test neg -advised symptomatic care and follow up if worsening or symptoms persist  Dog bite, initial encounter -domestic, contained animal she reports is UTD on all vaccines and rabies vaccine - reports this is required for pets to be seen in the facility -wound is healing great and there are no signs of infection today -tetanus booster  Vaccine counseling -tetanus booster  -Patient advised to return or notify a doctor immediately if symptoms  worsen or persist or new concerns arise.  Patient Instructions  BEFORE YOU LEAVE: -tetanus booster -strep test -follow up: for CPE w/ PCP and as needed  The wound looks great.  Animal Bite Animal bite wounds can get infected. It is important to get proper medical treatment. Ask your doctor if you need rabies treatment. Follow these instructions at  home: Wound care  Follow instructions from your doctor about how to take care of your wound. Make sure you:  Check your wound every day for signs of infection. Watch for: ? Redness, swelling, or pain that gets worse. ? Fluid, blood, or pus. Contact a doctor if:  You have redness, swelling, or pain that gets worse.  You have a general feeling of sickness (malaise).  You feel sick to your stomach (nauseous).  You throw up (vomit).  You have pain that does not get better. Get help right away if:  You have a red streak going away from your wound.  You have fluid, blood, or pus coming from your wound.  You have a fever or chills.  You have trouble moving your injured area.  You have numbness or tingling anywhere on your body. This information is not intended to replace advice given to you by your health care provider. Make sure you discuss any questions you have with your health care provider. Document Released: 06/23/2005 Document Revised: 11/29/2015 Document Reviewed: 11/08/2014 Elsevier Interactive Patient Education  2018 [employer]    INSTRUCTIONS FOR UPPER RESPIRATORY INFECTION:  -plenty of rest and fluids  -nasal saline wash 2-3 times daily (use prepackaged nasal saline or bottled/distilled water if making your own)   -can use AFRIN nasal spray for drainage and nasal congestion - but do NOT use longer then 3-4 days  -can use tylenol (in no history of liver disease) or ibuprofen (if no history of kidney disease, bowel bleeding or significant heart disease) as directed for aches and sorethroat  -in the winter time, using a humidifier at night is helpful (please follow cleaning instructions)  -if you are taking a cough medication - use only as directed, may also try a teaspoon of honey to coat the throat and throat lozenges. If given a cough medication with codeine or hydrocodone or other narcotic please be advised that this contains a strong and  potentially  addicting medication. Please follow instructions carefully, take as little as possible and only use AS NEEDED for severe cough. Discuss potential side effects with your pharmacy. Please do not drive or operate machinery while taking these types of medications. Please do not take other sedating medications, drugs or alcohol while taking this medication without discussing with your doctor.  -for sore throat, salt water gargles can help  -follow up if you have fevers, facial pain, tooth pain, difficulty breathing or are worsening or symptoms persist longer then expected  Upper Respiratory Infection, Adult An upper respiratory infection (URI) is also known as the common cold. It is often caused by a type of germ (virus). Colds are easily spread (contagious). You can pass it to others by kissing, coughing, sneezing, or drinking out of the same glass. Usually, you get better in 1 to 3  weeks.  However, the cough can last for even longer. HOME CARE   Only take medicine as told by your doctor. Follow instructions provided above.  Drink enough water and fluids to keep your pee (urine) clear or pale yellow.  Get plenty of rest.  Return to work when your temperature  is < 100 for 24 hours or as told by your doctor. You may use a face mask and wash your hands to stop your cold from spreading. GET HELP RIGHT AWAY IF:   After the first few days, you feel you are getting worse.  You have questions about your medicine.  You have chills, shortness of breath, or red spit (mucus).  You have pain in the face for more then 1-2 days, especially when you bend forward.  You have a fever, puffy (swollen) neck, pain when you swallow, or white spots in the back of your throat.  You have a bad headache, ear pain, sinus pain, or chest pain.  You have a high-pitched whistling sound when you breathe in and out (wheezing).  You cough up blood.  You have sore muscles or a stiff neck. MAKE SURE YOU:   Understand  these instructions.  Will watch your condition.  Will get help right away if you are not doing well or get worse. Document Released: 12/10/2007 Document Revised: 09/15/2011 Document Reviewed: 09/28/2013 [facility] Patient Information 2015 [place], [place]. This information is not intended to replace advice given to you by your health care provider. Make sure you discuss any questions you have with your health care provider.     Terressa Koyanagi, DO

## 2018-01-28 NOTE — Telephone Encounter (Signed)
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 States dog is up to date on all shots. Reports chills, alternating with diaphoresis, nausea and diarrhea since yesterday. States"I don't know if it's related or if I'm just getting sick."  States bite at middle finger right hand, redness 1/2 diameter. No drainage; has cleansed "With hydrogen peroxide" Left open to air. Reports 4-5 loose stools in 24 hour period. Does not have thermometer available. Unsure of last tetanus shot.  Same day appt made with Dr. Selena BattenKim according to availability. Care advise given per protocol. Pt verbalizes understanding.  Reason for Disposition . [1] Last tetanus shot > 10 years ago AND [2] any wound (e.g., cut, scrape)  Answer Assessment - Initial Assessment Questions 1. ANIMAL: "What type of animal caused the bite?" "Is the injury from a bite or a claw?" If the animal is a dog or a cat, ask: "Was it a pet or a stray?" "Was it acting ill or behaving strangely?"     Dog bite, known dog, animal is up to date on shots 2. LOCATION: "Where is the bite located?"      Left hand, middle knuckle 3. SIZE: "How big is the bite?" "What does it look like?"      1/2 in redness 4. ONSET: "When did the bite happen?" (Minutes or hours ago)      Monday 5. 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 6. TETANUS: "When was the last tetanus booster?"     Unsure, "I think never."  Protocols used: ANIMAL BITE-A-AH

## 2018-01-28 NOTE — Addendum Note (Signed)
Addended by: Johnella MoloneyFUNDERBURK, Mazey Mantell A on: 01/28/2018 02:38 PM   Modules accepted: Orders

## 2018-01-28 NOTE — Patient Instructions (Signed)
BEFORE YOU LEAVE: -tetanus booster -strep test -follow up: for CPE w/ PCP and as needed  The wound looks great.  Animal Bite Animal bite wounds can get infected. It is important to get proper medical treatment. Ask your doctor if you need rabies treatment. Follow these instructions at home: Wound care  Follow instructions from your doctor about how to take care of your wound. Make sure you:  Check your wound every day for signs of infection. Watch for: ? Redness, swelling, or pain that gets worse. ? Fluid, blood, or pus. Contact a doctor if:  You have redness, swelling, or pain that gets worse.  You have a general feeling of sickness (malaise).  You feel sick to your stomach (nauseous).  You throw up (vomit).  You have pain that does not get better. Get help right away if:  You have a red streak going away from your wound.  You have fluid, blood, or pus coming from your wound.  You have a fever or chills.  You have trouble moving your injured area.  You have numbness or tingling anywhere on your body. This information is not intended to replace advice given to you by your health care provider. Make sure you discuss any questions you have with your health care provider. Document Released: 06/23/2005 Document Revised: 11/29/2015 Document Reviewed: 11/08/2014 Elsevier Interactive Patient Education  2018 Elsevier Inc.    INSTRUCTIONS FOR UPPER RESPIRATORY INFECTION:  -plenty of rest and fluids  -nasal saline wash 2-3 times daily (use prepackaged nasal saline or bottled/distilled water if making your own)   -can use AFRIN nasal spray for drainage and nasal congestion - but do NOT use longer then 3-4 days  -can use tylenol (in no history of liver disease) or ibuprofen (if no history of kidney disease, bowel bleeding or significant heart disease) as directed for aches and sorethroat  -in the winter time, using a humidifier at night is helpful (please follow cleaning  instructions)  -if you are taking a cough medication - use only as directed, may also try a teaspoon of honey to coat the throat and throat lozenges. If given a cough medication with codeine or hydrocodone or other narcotic please be advised that this contains a strong and  potentially addicting medication. Please follow instructions carefully, take as little as possible and only use AS NEEDED for severe cough. Discuss potential side effects with your pharmacy. Please do not drive or operate machinery while taking these types of medications. Please do not take other sedating medications, drugs or alcohol while taking this medication without discussing with your doctor.  -for sore throat, salt water gargles can help  -follow up if you have fevers, facial pain, tooth pain, difficulty breathing or are worsening or symptoms persist longer then expected  Upper Respiratory Infection, Adult An upper respiratory infection (URI) is also known as the common cold. It is often caused by a type of germ (virus). Colds are easily spread (contagious). You can pass it to others by kissing, coughing, sneezing, or drinking out of the same glass. Usually, you get better in 1 to 3  weeks.  However, the cough can last for even longer. HOME CARE   Only take medicine as told by your doctor. Follow instructions provided above.  Drink enough water and fluids to keep your pee (urine) clear or pale yellow.  Get plenty of rest.  Return to work when your temperature is < 100 for 24 hours or as told by your doctor.  You may use a face mask and wash your hands to stop your cold from spreading. GET HELP RIGHT AWAY IF:   After the first few days, you feel you are getting worse.  You have questions about your medicine.  You have chills, shortness of breath, or red spit (mucus).  You have pain in the face for more then 1-2 days, especially when you bend forward.  You have a fever, puffy (swollen) neck, pain when you  swallow, or white spots in the back of your throat.  You have a bad headache, ear pain, sinus pain, or chest pain.  You have a high-pitched whistling sound when you breathe in and out (wheezing).  You cough up blood.  You have sore muscles or a stiff neck. MAKE SURE YOU:   Understand these instructions.  Will watch your condition.  Will get help right away if you are not doing well or get worse. Document Released: 12/10/2007 Document Revised: 09/15/2011 Document Reviewed: 09/28/2013 [facility] Patient Information 2015 [employer] [place] [place]. This information is not intended to replace advice given to you by your health care provider. Make sure you discuss any questions you have with your health care provider.

## 2018-03-03 ENCOUNTER — Ambulatory Visit (INDEPENDENT_AMBULATORY_CARE_PROVIDER_SITE_OTHER): Payer: No Typology Code available for payment source | Admitting: Licensed Clinical Social Worker

## 2018-03-03 DIAGNOSIS — F3341 Major depressive disorder, recurrent, in partial remission: Secondary | ICD-10-CM | POA: Diagnosis not present

## 2018-03-15 ENCOUNTER — Ambulatory Visit (INDEPENDENT_AMBULATORY_CARE_PROVIDER_SITE_OTHER): Payer: No Typology Code available for payment source | Admitting: Family Medicine

## 2018-03-15 ENCOUNTER — Encounter: Payer: Self-pay | Admitting: Family Medicine

## 2018-03-15 VITALS — BP 100/64 | HR 110 | Temp 98.3°F | Ht 62.0 in | Wt 106.0 lb

## 2018-03-15 DIAGNOSIS — R509 Fever, unspecified: Secondary | ICD-10-CM

## 2018-03-15 DIAGNOSIS — J019 Acute sinusitis, unspecified: Secondary | ICD-10-CM

## 2018-03-15 LAB — POC INFLUENZA A&B (BINAX/QUICKVUE)
INFLUENZA A, POC: NEGATIVE
INFLUENZA B, POC: NEGATIVE

## 2018-03-15 LAB — POCT RAPID STREP A (OFFICE): Rapid Strep A Screen: NEGATIVE

## 2018-03-15 MED ORDER — AZITHROMYCIN 250 MG PO TABS: ORAL_TABLET | ORAL | 0 refills | Status: DC

## 2018-03-15 MED ORDER — PROMETHAZINE HCL 25 MG PO TABS: 25.0000 mg | ORAL_TABLET | ORAL | 0 refills | Status: DC | PRN

## 2018-03-15 NOTE — Progress Notes (Signed)
   Subjective:    Patient ID: Paige Herrera, female    DOB: 17-Jun-1996, 22 y.o.   MRN: [id number]  HPI Here for 3 days of sinus pressure, PND ,ST, and coughing up green sputum. She vomited once but not today. Drinking fluids.    Review of Systems  Constitutional: Positive for fever.  HENT: Positive for congestion, postnasal drip, sinus pressure and sore throat. Negative for sinus pain.   Eyes: Negative.   Respiratory: Positive for cough.   Gastrointestinal: Positive for nausea and vomiting.       Objective:   Physical Exam  Constitutional:  Appears ill   HENT:  Right Ear: External ear normal.  Left Ear: External ear normal.  Nose: Nose normal.  Mouth/Throat: Oropharynx is clear and moist.  Eyes: Conjunctivae are normal.  Neck: Neck supple. No thyromegaly present.  Pulmonary/Chest: Effort normal and breath sounds normal. No stridor. No respiratory distress. She has no wheezes. She has no rales.  Lymphadenopathy:    She has no cervical adenopathy.          Assessment & Plan:  Sinusitis, treat with a Zpack. Written out of classes for tomorrow and Wednesday.  Gershon Crane, MD

## 2018-03-18 ENCOUNTER — Encounter: Payer: Self-pay | Admitting: Family Medicine

## 2018-03-18 ENCOUNTER — Ambulatory Visit (INDEPENDENT_AMBULATORY_CARE_PROVIDER_SITE_OTHER): Payer: No Typology Code available for payment source | Admitting: Family Medicine

## 2018-03-18 VITALS — BP 104/80 | HR 115 | Temp 98.3°F | Ht 62.0 in | Wt 103.4 lb

## 2018-03-18 DIAGNOSIS — J019 Acute sinusitis, unspecified: Secondary | ICD-10-CM

## 2018-03-18 NOTE — Progress Notes (Signed)
   Subjective:    Patient ID: Paige Herrera, female    DOB: 06/04/1996, 22 y.o.   MRN: [id number]  HPI Here to follow up on an upper respiratory infection, likely a sinusitis. She is part way through a Zpack and in some ways she feels better. The head congestion and ST and coughing have improved. However she is still very fatigued and the nausea makes it hard for her to take in any food. She was given Phenergan for nausea but for some reason she has not taken any.    Review of Systems  Constitutional: Positive for fatigue. Negative for chills, diaphoresis and fever.  HENT: Negative.   Eyes: Negative.   Respiratory: Negative.   Cardiovascular: Negative.   Gastrointestinal: Positive for nausea. Negative for abdominal distention, abdominal pain, anal bleeding, blood in stool, constipation, diarrhea, rectal pain and vomiting.       Objective:   Physical Exam  Constitutional: She is oriented to person, place, and time. She appears well-developed and well-nourished.  She looks much better than a few days ago   Neck: No thyromegaly present.  Cardiovascular: Normal rate, regular rhythm, normal heart sounds and intact distal pulses.  Pulmonary/Chest: Effort normal and breath sounds normal. No stridor. No respiratory distress. She has no wheezes. She has no rales.  Abdominal: Soft. Bowel sounds are normal. She exhibits no distension and no mass. There is no tenderness. There is no rebound and no guarding.  Lymphadenopathy:    She has no cervical adenopathy.  Neurological: She is oriented to person, place, and time.          Assessment & Plan:  Partially treated sinusitis. I encouraged her to finish out the Zpack. Drink fluids. I urged her to use the Phenergan to control the nausea so she can get some food in her system. She has been written out of class until 03-22-18. Recheck prn. Gershon CraneStephen Dajia Gunnels, MD

## 2018-03-31 ENCOUNTER — Ambulatory Visit (INDEPENDENT_AMBULATORY_CARE_PROVIDER_SITE_OTHER): Payer: No Typology Code available for payment source | Admitting: Licensed Clinical Social Worker

## 2018-03-31 DIAGNOSIS — F3341 Major depressive disorder, recurrent, in partial remission: Secondary | ICD-10-CM | POA: Diagnosis not present

## 2018-04-12 ENCOUNTER — Ambulatory Visit (INDEPENDENT_AMBULATORY_CARE_PROVIDER_SITE_OTHER): Payer: No Typology Code available for payment source | Admitting: Licensed Clinical Social Worker

## 2018-04-12 DIAGNOSIS — F3341 Major depressive disorder, recurrent, in partial remission: Secondary | ICD-10-CM | POA: Diagnosis not present

## 2018-04-27 ENCOUNTER — Encounter: Payer: Self-pay | Admitting: Emergency Medicine

## 2018-04-27 DIAGNOSIS — F451 Undifferentiated somatoform disorder: Secondary | ICD-10-CM | POA: Insufficient documentation

## 2018-04-27 DIAGNOSIS — F411 Generalized anxiety disorder: Secondary | ICD-10-CM | POA: Insufficient documentation

## 2018-04-27 DIAGNOSIS — F330 Major depressive disorder, recurrent, mild: Secondary | ICD-10-CM | POA: Insufficient documentation

## 2018-04-28 ENCOUNTER — Ambulatory Visit (INDEPENDENT_AMBULATORY_CARE_PROVIDER_SITE_OTHER): Payer: No Typology Code available for payment source | Admitting: Licensed Clinical Social Worker

## 2018-04-28 DIAGNOSIS — F3341 Major depressive disorder, recurrent, in partial remission: Secondary | ICD-10-CM | POA: Diagnosis not present

## 2018-05-04 ENCOUNTER — Ambulatory Visit (INDEPENDENT_AMBULATORY_CARE_PROVIDER_SITE_OTHER): Payer: No Typology Code available for payment source | Admitting: Psychiatry

## 2018-05-04 ENCOUNTER — Encounter: Payer: Self-pay | Admitting: Psychiatry

## 2018-05-04 VITALS — BP 102/68 | HR 80 | Ht 62.0 in | Wt 107.0 lb

## 2018-05-04 DIAGNOSIS — F411 Generalized anxiety disorder: Secondary | ICD-10-CM | POA: Diagnosis not present

## 2018-05-04 DIAGNOSIS — F4312 Post-traumatic stress disorder, chronic: Secondary | ICD-10-CM

## 2018-05-04 DIAGNOSIS — F330 Major depressive disorder, recurrent, mild: Secondary | ICD-10-CM

## 2018-05-04 DIAGNOSIS — F451 Undifferentiated somatoform disorder: Secondary | ICD-10-CM | POA: Diagnosis not present

## 2018-05-04 MED ORDER — DIAZEPAM 5 MG PO TABS: 5.0000 mg | ORAL_TABLET | Freq: Two times a day (BID) | ORAL | 2 refills | Status: AC | PRN

## 2018-05-04 MED ORDER — BUPROPION HCL ER (XL) 300 MG PO TB24: 300.0000 mg | ORAL_TABLET | Freq: Every day | ORAL | 2 refills | Status: DC

## 2018-05-04 NOTE — Progress Notes (Signed)
[facility]  Patient ID: Paige Herrera,  MRN: [id number]  PCP: [name], MD  Date of Evaluation: 05/04/2018 Time spent:20 minutes  Chief Complaint:  Chief Complaint    Anxiety; Depression      HISTORY/CURRENT STATUS: [name] is seen individually face-to-face with consent with collateral of brother Conner's recent appointment for psychiatric interview and exam in 10-month evaluation and management of anxiety and depression.  Patient is doing well in school making A's and B's though her expected four classes became five with much graphic equipment adjustment for her photography courses with which brother helped but considered patient less able to master such.  The patient has visits from boyfriend at [facility] almost every weekend and she has been responsible caring for mother's home as mother cared for dying grandfather who died 2 weeks ago resulting in mother's need for hospitalization for suicide risk of alcohol her emotions.  Father has been supportive financially and emotionally for the patient as mother decompensated the patient stating she almost moved to [place].  The patient rework's all these issues including her question whether she will always need medication to help stabilize her emotions.  She is taking her Wellbutrin 300 mg XL in the morning and the diazepam 5 mg twice daily as needed for anxiety.  Anxiety  Presents for follow-up visit. Symptoms include confusion, decreased concentration, depressed mood, dizziness, dry mouth, excessive worry, muscle tension, nausea and nervous/anxious behavior. Patient reports no chest pain, compulsions, feeling of choking, hyperventilation, impotence, insomnia, irritability, malaise, obsessions, palpitations, panic, restlessness, shortness of breath or suicidal ideas. Symptoms occur occasionally. The most recent episode lasted 6 hours. The severity of symptoms is causing significant distress and moderate. The  patient sleeps 7 hours per night. The quality of sleep is fair. Nighttime awakenings: one to two.   Her past medical history is significant for depression. There is no history of suicide attempts. Compliance with medications is 76-100%.  Depression       The patient presents with depression.  This is a recurrent problem.  The current episode started more than 1 year ago.   The onset quality is gradual.   The problem occurs every several days.  The most recent episode lasted 2 days.    The problem has been gradually improving since onset.  Associated symptoms include decreased concentration, fatigue, decreased interest, appetite change, headaches, indigestion and sad.  Associated symptoms include no helplessness, no hopelessness, does not have insomnia, not irritable, no restlessness, no body aches, no myalgias and no suicidal ideas.     The symptoms are aggravated by medication, work stress, family issues and social issues.  Past treatments include other medications.  Risk factors include family history of mental illness, family history and major life event.   Past medical history includes anxiety, depression, mental health disorder and post-traumatic stress disorder.     Pertinent negatives include no chronic pain, no fibromyalgia, no hypothyroidism, no thyroid problem, no chronic illness, no recent illness, no life-threatening condition, no physical disability, no terminal illness, no recent psychiatric admission, no Alzheimer's disease, no brain trauma, no dementia, no bipolar disorder, no eating disorder, no obsessive-compulsive disorder, no schizophrenia, no suicide attempts and no head trauma.   Individual Medical History/ Review of Systems: Changes? :No   Allergies: Tamiflu [oseltamivir phosphate]  Current Medications:  Current Outpatient Medications:  .  azithromycin (ZITHROMAX Z-PAK) 250 MG tablet, As directed, Disp: 6 each, Rfl: 0 .  buPROPion (WELLBUTRIN XL) 300 MG 24 hr  tablet, Take 1  tablet (300 mg total) by mouth daily., Disp: 30 tablet, Rfl: 2 .  diazepam (VALIUM) 5 MG tablet, Take 1 tablet (5 mg total) by mouth 2 (two) times daily as needed. for anxiety, Disp: 60 tablet, Rfl: 2 .  NIKKI 3-0.02 MG tablet, TAKE 1 TABLET BY MOUTH DAILY., Disp: 28 tablet, Rfl: 10 .  OVER THE COUNTER MEDICATION, Allergy tec, Disp: , Rfl:  .  promethazine (PHENERGAN) 25 MG tablet, Take 1 tablet (25 mg total) by mouth every 4 (four) hours as needed for nausea or vomiting., Disp: 30 tablet, Rfl: 0 .  Pseudoephedrine-Ibuprofen 30-200 MG TABS, Take by mouth., Disp: , Rfl:  Medication Side Effects: none  Family Medical/ Social History: Changes? Yes, father having fewer problems than the anger noted by family and mother decompensating in suicidal drinking after the death of her father.  Brother has anxiety and sleepwalking.  Family did not appreciate older sister's husband till after their marriage now with similar opinion of the patient's boyfriend.  MENTAL HEALTH EXAM: Muscle strength 5/5, postural reflexes 0/0, and AIMS = 0 Blood pressure 102/68, pulse 80, height 5\' 2"  (1.575 m), weight 107 lb (48.5 kg).Body mass index is 19.57 kg/m.  General Appearance: Casual, Disheveled and Guarded  Eye Contact:  Good  Speech:  Clear and Coherent  Volume:  Normal  Mood:  Anxious and Dysphoric  Affect:  Constricted, Inappropriate, Full Range and Anxious  Thought Process:  Disorganized and Goal Directed  Orientation:  Full (Time, Place, and Person)  Thought Content: Ilusions, Obsessions and Rumination   Suicidal Thoughts:  No  Homicidal Thoughts:  No  Memory:  Immediate;   Fair  Judgement:  Fair  Insight:  Fair  Psychomotor Activity:  Increased and Mannerisms  Concentration:  Concentration: Good and Attention Span: Fair  Recall:  Good  Fund of Knowledge: Good  Language: Fair  Assets:  Desire for Improvement Talents/Skills Vocational/Educational  ADL's:  Intact  Cognition: WNL  Prognosis:  Good     DIAGNOSES:    ICD-10-CM   1. Major depressive disorder, recurrent episode, mild (HCC) F33.0 buPROPion (WELLBUTRIN XL) 300 MG 24 hr tablet  2. Generalized anxiety disorder F41.1 buPROPion (WELLBUTRIN XL) 300 MG 24 hr tablet    diazepam (VALIUM) 5 MG tablet  3. 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  Berniece Andreas, LCSW at [facility]   RECOMMENDATIONS: Patient allows thorough reworking of all acute stressors for past somatization, current posttraumatic and generalized anxiety, and partially resolved atypical depression.  She agrees to continue Wellbutrin and Valium after stopping Lamictal last spring.  She is prescribed diazepam 5 mg twice daily as needed for anxiety #30 with [facility] [address]  She is prescribed Wellbutrin 300 mg XL every morning #30 with [facility].  She is educated on therapy, warnings and risk of medication, safety hygiene and crisis plans if needed, to return in 2 months.   Chauncey Mann, MD

## 2018-05-06 ENCOUNTER — Ambulatory Visit (INDEPENDENT_AMBULATORY_CARE_PROVIDER_SITE_OTHER): Payer: No Typology Code available for payment source | Admitting: Licensed Clinical Social Worker

## 2018-05-06 DIAGNOSIS — F3341 Major depressive disorder, recurrent, in partial remission: Secondary | ICD-10-CM

## 2018-05-12 ENCOUNTER — Ambulatory Visit (INDEPENDENT_AMBULATORY_CARE_PROVIDER_SITE_OTHER): Payer: No Typology Code available for payment source | Admitting: Licensed Clinical Social Worker

## 2018-05-12 DIAGNOSIS — F3341 Major depressive disorder, recurrent, in partial remission: Secondary | ICD-10-CM | POA: Diagnosis not present

## 2018-05-28 ENCOUNTER — Encounter: Payer: Self-pay | Admitting: Internal Medicine

## 2018-05-28 ENCOUNTER — Ambulatory Visit (INDEPENDENT_AMBULATORY_CARE_PROVIDER_SITE_OTHER): Payer: No Typology Code available for payment source | Admitting: Internal Medicine

## 2018-05-28 VITALS — BP 120/80 | HR 109 | Temp 98.1°F | Wt 109.3 lb

## 2018-05-28 DIAGNOSIS — L708 Other acne: Secondary | ICD-10-CM | POA: Diagnosis not present

## 2018-05-28 DIAGNOSIS — Z23 Encounter for immunization: Secondary | ICD-10-CM | POA: Diagnosis not present

## 2018-05-28 NOTE — Progress Notes (Signed)
Acute Office Visit     CC/Reason for Visit: "Spot behind my right ear"  HPI: Paige Herrera is a 22 y.o. female who is coming in today for the above mentioned reasons.  She states that for the past week she has noticed what began as a papule right below her right ear that has progressively become more inflamed and pus filled.  She has not had fevers or chills, has not recently changed face products.  She would also like a flu shot today.  Past Medical/Surgical History: Past Medical History:  Diagnosis Date  . 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 ADHD 05/03/2009  . ALLERGIC RHINITIS 07/14/2007  . 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 ASTHMA 07/14/2007  . EBV infection 11/28/2014  . ECZEMA, HX OF 07/14/2007  . Headache(784.0) 11/27/2008  . Hemoptysis 11/28/2014  . Hepatitis 11/28/2014  . LEARNING DISABILITY 10/08/2007  . Migraine with aura 11/28/2014  . 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 NO PAST SURGERIES      Social History:  reports that she has never smoked. She has never used smokeless tobacco. She reports that she does not drink alcohol or use drugs.  Allergies: Allergies  Allergen Reactions  . Tamiflu [Oseltamivir Phosphate] Hives    Family History:  Family History  Problem Relation Age of Onset  . Asthma Mother   . Allergies Mother   . Hypertension Father      Current Outpatient Medications:  .  buPROPion (WELLBUTRIN XL) 300 MG 24 hr tablet, Take 1 tablet (300 mg total) by mouth daily., Disp: 30 tablet, Rfl: 2 .  diazepam (VALIUM) 5 MG tablet, Take 1 tablet (5 mg total) by mouth 2 (two) times daily as needed. for anxiety, Disp: 60 tablet, Rfl: 2 .  NIKKI 3-0.02 MG tablet, TAKE 1 TABLET BY MOUTH DAILY., Disp: 28 tablet, Rfl: 10 .  OVER THE COUNTER MEDICATION, Allergy tec, Disp: , Rfl:  .  Pseudoephedrine-Ibuprofen 30-200 MG TABS, Take by mouth., Disp: , Rfl:   Review of Systems:  Constitutional: Denies fever,  chills, diaphoresis, appetite change and fatigue.  HEENT: Denies photophobia, eye pain, redness, hearing loss, ear pain, congestion, sore throat, rhinorrhea, sneezing, mouth sores, trouble swallowing, neck pain, neck stiffness and tinnitus.   Respiratory: Denies SOB, DOE, cough, chest tightness,  and wheezing.   Cardiovascular: Denies chest pain, palpitations and leg swelling.  Unless noted review of systems is otherwise negative.     Physical Exam: Vitals:   05/28/18 0751  BP: 120/80  Pulse: (!) 109  Temp: 98.1 F (36.7 C)  TempSrc: Oral  SpO2: 96%  Weight: 109 lb 4.8 oz (49.6 kg)    Body mass index is 19.99 kg/m.   Constitutional: NAD, calm, comfortable Eyes: PERRL, lids and conjunctivae normal ENMT: Mucous membranes are moist. Posterior pharynx clear of any exudate or lesions. Normal dentition. Tympanic membrane is pearly white, no erythema or bulging. Respiratory: clear to auscultation bilaterally, no wheezing, no crackles. Normal respiratory effort. No accessory muscle use.  Skin: Please see below picture regarding her cystic acne:         Impression and Plan:  ACNE VULGARIS  -This looks like cystic acne and is similar to other lesions around her face although this one is more inflamed. -Have recommended topical measures, advised to continue to use her acne facial products.  Return if worsening. -She has seen dermatology in the past for her acne, have advised  that she may return to see them if she feels like her acne is not improving.   Patient Instructions  -You will receive your flu shot today.  -The spot behind your ear is probably your acne.  Treat as you would normally do with cleansing, toner and moisturizer.  Please see dermatology if you feel that you need to for continued acne management.     Chaya JanEstela Hernandez Acosta, MD [facility]

## 2018-05-28 NOTE — Patient Instructions (Signed)
-  You will receive your flu shot today.  -The spot behind your ear is probably your acne.  Treat as you would normally do with cleansing, toner and moisturizer.  Please see dermatology if you feel that you need to for continued acne management.

## 2018-06-01 ENCOUNTER — Ambulatory Visit: Payer: No Typology Code available for payment source | Admitting: Licensed Clinical Social Worker

## 2018-06-22 ENCOUNTER — Telehealth: Payer: Self-pay | Admitting: Psychiatry

## 2018-06-22 NOTE — Telephone Encounter (Signed)
[name] [facility] uncertain of providers she might use.  She seeks reassurance regarding obtaining her medication having a couple of refills remaining that might be transferred by the [facility] here, but she will let us know if we can facilitate a supply of medications as long as she is doing well from the move and can certainly offer hope with providers if she has some options.

## 2018-06-24 ENCOUNTER — Ambulatory Visit: Payer: No Typology Code available for payment source | Admitting: Licensed Clinical Social Worker

## 2018-07-14 ENCOUNTER — Ambulatory Visit: Payer: No Typology Code available for payment source | Admitting: Psychiatry

## 2018-08-14 ENCOUNTER — Other Ambulatory Visit: Payer: Self-pay | Admitting: Family Medicine

## 2018-08-15 ENCOUNTER — Other Ambulatory Visit: Payer: Self-pay | Admitting: Psychiatry

## 2018-08-15 DIAGNOSIS — F330 Major depressive disorder, recurrent, mild: Secondary | ICD-10-CM

## 2018-08-15 DIAGNOSIS — F411 Generalized anxiety disorder: Secondary | ICD-10-CM

## 2018-08-15 DIAGNOSIS — F4312 Post-traumatic stress disorder, chronic: Secondary | ICD-10-CM

## 2018-09-16 ENCOUNTER — Other Ambulatory Visit: Payer: Self-pay | Admitting: Psychiatry

## 2018-09-16 DIAGNOSIS — F4312 Post-traumatic stress disorder, chronic: Secondary | ICD-10-CM

## 2018-09-16 DIAGNOSIS — F330 Major depressive disorder, recurrent, mild: Secondary | ICD-10-CM

## 2018-09-16 DIAGNOSIS — F411 Generalized anxiety disorder: Secondary | ICD-10-CM

## 2018-09-17 NOTE — Telephone Encounter (Signed)
Patient's brother at his appointments notes Paige Herrera is doing well in [place] that she had alerted [place] at the time of her move in December that she was transferring any refills from [facility] to [place] but may need extra until she can establishes provider.  The [facility] request from [place] for a month supply and 1 refill is appropriate as to Wellbutrin 300 mg XL every morning medically necessary with no contraindication.

## 2018-09-17 NOTE — Telephone Encounter (Signed)
Due in January, [place] at [place]  Refills ok?

## 2018-10-01 ENCOUNTER — Other Ambulatory Visit: Payer: Self-pay | Admitting: Psychiatry

## 2018-10-01 DIAGNOSIS — F411 Generalized anxiety disorder: Secondary | ICD-10-CM

## 2018-10-01 DIAGNOSIS — F4312 Post-traumatic stress disorder, chronic: Secondary | ICD-10-CM

## 2018-10-01 DIAGNOSIS — F330 Major depressive disorder, recurrent, mild: Secondary | ICD-10-CM

## 2022-02-06 ENCOUNTER — Ambulatory Visit: Payer: No Typology Code available for payment source | Admitting: Psychology
